# Patient Record
Sex: Female | Born: 2000 | Race: White | Hispanic: Yes | Marital: Single | Smoking: Never smoker
Health system: Southern US, Community
[De-identification: ages and names within clinical notes are randomized; demographics above are authoritative.]

## PROBLEM LIST (undated history)

## (undated) DIAGNOSIS — G932 Benign intracranial hypertension: Secondary | ICD-10-CM

## (undated) HISTORY — PX: CHOLECYSTECTOMY: SHX55

## (undated) HISTORY — DX: Benign intracranial hypertension: G93.2

---
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
## 2022-08-02 ENCOUNTER — Other Ambulatory Visit: Payer: Self-pay | Admitting: Ophthalmology

## 2022-08-02 DIAGNOSIS — G932 Benign intracranial hypertension: Secondary | ICD-10-CM

## 2022-10-14 DIAGNOSIS — Z1389 Encounter for screening for other disorder: Secondary | ICD-10-CM | POA: Diagnosis not present

## 2022-11-02 DIAGNOSIS — F411 Generalized anxiety disorder: Secondary | ICD-10-CM | POA: Diagnosis not present

## 2022-11-02 DIAGNOSIS — Z01419 Encounter for gynecological examination (general) (routine) without abnormal findings: Secondary | ICD-10-CM | POA: Diagnosis not present

## 2022-11-02 DIAGNOSIS — Z1389 Encounter for screening for other disorder: Secondary | ICD-10-CM | POA: Diagnosis not present

## 2022-11-02 DIAGNOSIS — Z23 Encounter for immunization: Secondary | ICD-10-CM | POA: Diagnosis not present

## 2022-11-02 DIAGNOSIS — Z113 Encounter for screening for infections with a predominantly sexual mode of transmission: Secondary | ICD-10-CM | POA: Diagnosis not present

## 2022-11-02 DIAGNOSIS — Z124 Encounter for screening for malignant neoplasm of cervix: Secondary | ICD-10-CM | POA: Diagnosis not present

## 2022-11-23 ENCOUNTER — Ambulatory Visit
Admission: RE | Admit: 2022-11-23 | Discharge: 2022-11-23 | Disposition: A | Discharge status: Home / Self Care | Payer: Medicaid Other | Source: Ambulatory Visit | Attending: Ophthalmology | Admitting: Ophthalmology

## 2022-11-23 DIAGNOSIS — G932 Benign intracranial hypertension: Secondary | ICD-10-CM

## 2022-11-23 DIAGNOSIS — H4710 Unspecified papilledema: Secondary | ICD-10-CM | POA: Diagnosis not present

## 2022-11-23 MED ORDER — GADOPICLENOL 0.5 MMOL/ML IV SOLN
7.5000 mL | Freq: Once | INTRAVENOUS | Status: AC | PRN
  Administered 2022-11-23: 7.5 mL via INTRAVENOUS

## 2022-12-23 ENCOUNTER — Ambulatory Visit: Payer: Medicaid Other | Admitting: Diagnostic Neuroimaging

## 2022-12-23 ENCOUNTER — Encounter: Payer: Self-pay | Admitting: Diagnostic Neuroimaging

## 2022-12-23 VITALS — BP 128/58 | HR 88 | Ht 67.0 in | Wt 276.8 lb

## 2022-12-23 DIAGNOSIS — G932 Benign intracranial hypertension: Secondary | ICD-10-CM

## 2022-12-23 MED ORDER — ACETAZOLAMIDE 250 MG PO TABS: 250.0000 mg | ORAL_TABLET | Freq: Two times a day (BID) | ORAL | 12 refills | Status: DC

## 2022-12-23 NOTE — Patient Instructions (Addendum)
  idiopathic intracranial hypertension (pseudotumor cerebri)   - check LP (opening pressure and therapeutic drainage)  - start acetazolamide 250mg  twice a day; increase to 500mg  twice a day after 1-2 weeks  - follow up with Dr. Manuella Ghazi in 3 months

## 2022-12-23 NOTE — Progress Notes (Signed)
[facility]  PATIENT: Brianna Sutton DOB: 02/20/2001  REFERRING CLINICIAN: Bryson Ha, OD HISTORY FROM: PATIENT  REASON FOR VISIT: NEW CONSULT   HISTORICAL  CHIEF COMPLAINT:  Chief Complaint  Patient presents with   New Patient (Initial Visit)    Patient in room #7 with her husband. Patient states she here to discuss and review her MRI of the brain.    HISTORY OF PRESENT ILLNESS:   22 year old female here for evaluation of papilledema.  Patient had routine eye exam in December 2023, and papilledema was noted.  She was referred to retina specialist for further evaluation.  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  MRI brain and orbits were ordered.  Patient has had some issues with weight gain in the last 3 months.  She has had some episodes of transient visual obscuration with colorful lights, with some episodes triggered by straining with weight lifting.  Sometimes associated with severe pressure explosion sensation in her head. She is also had some left-sided ringing in your ears where she can hear her heartbeat.  All the symptoms started in the last 3 months.  Patient had pregnancy and delivery in 2020 with epidural.  Now patient has IUD for contraception.    REVIEW OF SYSTEMS: Full 14 system review of systems performed and negative with exception of: as per HPI.  ALLERGIES: No Known Allergies  HOME MEDICATIONS: Outpatient Medications Prior to Visit  Medication Sig Dispense Refill   hydrOXYzine (VISTARIL) 25 MG capsule Take 1 capsule (25 mg total) by mouth 3 (three) times daily as needed. 30 capsule 0   sertraline (ZOLOFT) 50 MG tablet Take 50 mg by mouth daily.     predniSONE (DELTASONE) 10 MG tablet Take 5 tablets (50 mg total) by mouth daily with breakfast. 25 tablet 0   hydrOXYzine (ATARAX) 25 MG tablet Take 25 mg by mouth every 6 (six) hours as needed. (Patient not taking: Reported on 12/23/2022)     ranitidine (ZANTAC) 150 MG tablet Take 1  tablet (150 mg total) by mouth 2 (two) times daily. 14 tablet 1   No facility-administered medications prior to visit.    PAST MEDICAL HISTORY: No past medical history on file.  PAST SURGICAL HISTORY: No past surgical history on file.  FAMILY HISTORY: No family history on file.  SOCIAL HISTORY: Social History   Socioeconomic History   Marital status: Single    Spouse name: Not on file   Number of children: Not on file   Years of education: Not on file   Highest education level: Not on file  Occupational History   Not on file  Tobacco Use   Smoking status: Never   Smokeless tobacco: Not on file  Substance and Sexual Activity   Alcohol use: No   Drug use: Not on file   Sexual activity: Not on file  Other Topics Concern   Not on file  Social History Narrative   Not on file   Social Determinants of Health   Financial Resource Strain: Not on file  Food Insecurity: Not on file  Transportation Needs: Not on file  Physical Activity: Not on file  Stress: Not on file  Social Connections: Not on file  Intimate Partner Violence: Not on file     PHYSICAL EXAM  GENERAL EXAM/CONSTITUTIONAL: Vitals:  Vitals:   12/23/22 1454  BP: (!) 128/58  Pulse: 88  Weight: 276 lb 12.8 oz (125.6 kg)  Height: 5\' 7"  (1.702 m)   Body mass index is  43.35 kg/m. Wt Readings from Last 3 Encounters:  12/23/22 276 lb 12.8 oz (125.6 kg)  07/06/15 180 lb (81.6 kg) (96 %, Z= 1.80)*   * Growth percentiles are based on CDC (Girls, 2-20 Years) data.   Patient is in no distress; well developed, nourished and groomed; neck is supple  CARDIOVASCULAR: Examination of carotid arteries is normal; no carotid bruits Regular rate and rhythm, no murmurs Examination of peripheral vascular system by observation and palpation is normal  EYES: Ophthalmoscopic exam of optic discs and posterior segments is normal; no papilledema or hemorrhages No results found.  MUSCULOSKELETAL: Gait, strength,  tone, movements noted in Neurologic exam below  NEUROLOGIC: MENTAL STATUS:      No data to display         awake, alert, oriented to person, place and time recent and remote memory intact normal attention and concentration language fluent, comprehension intact, naming intact fund of knowledge appropriate  CRANIAL NERVE:  2nd - MILD BILATERAL PAPILLEDEMA 2nd, 3rd, 4th, 6th - pupils equal and reactive to light, visual fields full to confrontation, extraocular muscles intact, no nystagmus 5th - facial sensation symmetric 7th - facial strength symmetric 8th - hearing intact 9th - palate elevates symmetrically, uvula midline 11th - shoulder shrug symmetric 12th - tongue protrusion midline  MOTOR:  normal bulk and tone, full strength in the BUE, BLE  SENSORY:  normal and symmetric to light touch, temperature, vibration  COORDINATION:  finger-nose-finger, fine finger movements normal  REFLEXES:  deep tendon reflexes present and symmetric  GAIT/STATION:  narrow based gait     DIAGNOSTIC DATA (LABS, IMAGING, TESTING) - I reviewed patient records, labs, notes, testing and imaging myself where available.  Lab Results  Component Value Date   WBC 10.3 05/28/2017   HGB 13.9 05/28/2017   HCT 40.1 05/28/2017   MCV 82.7 05/28/2017   PLT 263 05/28/2017      Component Value Date/Time   NA 142 05/28/2017 1130   NA 138 07/25/2013 0607   K 4.0 05/28/2017 1130   K 3.8 07/25/2013 0607   CL 108 05/28/2017 1130   CL 107 07/25/2013 0607   CO2 25 05/28/2017 1130   CO2 24 07/25/2013 0607   GLUCOSE 101 (H) 05/28/2017 1130   GLUCOSE 97 07/25/2013 0607   BUN 8 05/28/2017 1130   BUN 10 07/25/2013 0607   CREATININE 0.63 05/28/2017 1130   CREATININE 0.61 07/25/2013 0607   CALCIUM 10.0 05/28/2017 1130   CALCIUM 9.2 07/25/2013 0607   PROT 7.9 05/28/2017 1130   PROT 7.4 07/25/2013 0607   ALBUMIN 4.6 05/28/2017 1130   ALBUMIN 4.1 07/25/2013 0607   AST 22 05/28/2017 1130   AST  16 07/25/2013 0607   ALT 17 05/28/2017 1130   ALT 17 07/25/2013 0607   ALKPHOS 85 05/28/2017 1130   ALKPHOS 158 07/25/2013 0607   BILITOT 0.5 05/28/2017 1130   BILITOT 0.5 07/25/2013 0607   GFRNONAA NOT CALCULATED 05/28/2017 1130   GFRAA NOT CALCULATED 05/28/2017 1130   Lab Results  Component Value Date   CHOL 205 (H) 05/28/2017   HDL 40 (L) 05/28/2017   LDLCALC 137 (H) 05/28/2017   TRIG 138 05/28/2017   CHOLHDL 5.1 05/28/2017   Lab Results  Component Value Date   HGBA1C 5.0 05/28/2017   No results found for: "VITAMINB12" Lab Results  Component Value Date   TSH 3.000 05/22/2016    11/23/22 MRI brain / orbits -Intra-ocular protrusion of the optic nerve heads bilaterally. Vertical tortuosity  of the intraorbital optic nerves with mild CSF prominence in the bilateral optic nerve sheaths. This constellation of findings suggests idiopathic intracranial hypertension (pseudotumor cerebri). -Additionally, narrowing of the bilateral distal transverse dural venous sinuses is questioned. This finding can also be seen in the setting of idiopathic intracranial hypertension. Consider an MR venogram of the head for further evaluation. 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 Otherwise unremarkable MRI appearance of the brain itself.    ASSESSMENT AND PLAN  22 y.o. year old female here with:  Dx:  1. IIH (idiopathic intracranial hypertension)     PLAN:  idiopathic intracranial hypertension (pseudotumor cerebri)  - check LP (opening pressure and therapeutic drainage) - start acetazolamide 250mg  twice a day; increase to 500mg  twice a day after 1-2 weeks - mild gradual weight loss encouraged - follow up with Dr. Manuella Ghazi in 3 months; then follow up with me in 4 months  Orders Placed This Encounter  Procedures   DG FL GUIDED LUMBAR PUNCTURE   Meds ordered this encounter  Medications   acetaZOLAMIDE (DIAMOX) 250 MG tablet    Sig: Take 1 tablet  (250 mg total) by mouth 2 (two) times daily.    Dispense:  60 tablet    Refill:  12   Return in about 4 months (around 04/24/2023).  I reviewed images, labs, notes, records myself. I summarized findings and reviewed with patient, for this high risk condition (idiopathic intracranial hypertension (pseudotumor cerebri)) requiring high complexity decision making.   Penni Bombard, MD [id number], [id number] PM Certified in Neurology, Neurophysiology and Neuroimaging  [facility] [address] [place], [place] [address] [phone]

## 2023-01-26 ENCOUNTER — Telehealth: Payer: Self-pay | Admitting: Diagnostic Neuroimaging

## 2023-01-26 NOTE — Telephone Encounter (Signed)
Sent mychart msg informing pt of appointment change due to provider template change 

## 2023-02-09 ENCOUNTER — Ambulatory Visit
Admission: RE | Admit: 2023-02-09 | Discharge: 2023-02-09 | Disposition: A | Discharge status: Home / Self Care | Payer: Medicaid Other | Source: Ambulatory Visit | Attending: Diagnostic Neuroimaging | Admitting: Diagnostic Neuroimaging

## 2023-02-09 DIAGNOSIS — G932 Benign intracranial hypertension: Secondary | ICD-10-CM | POA: Diagnosis not present

## 2023-02-09 DIAGNOSIS — H4710 Unspecified papilledema: Secondary | ICD-10-CM | POA: Diagnosis not present

## 2023-02-09 LAB — CSF CELL COUNT WITH DIFFERENTIAL: TOTAL NUCLEATED CELL: 1 cells/uL (ref 0–5)

## 2023-02-09 LAB — CSF CULTURE W GRAM STAIN: MICRO NUMBER:: 14929560

## 2023-02-09 LAB — GLUCOSE, CSF: Glucose, CSF: 54 mg/dL (ref 40–80)

## 2023-02-09 NOTE — Discharge Instructions (Signed)

## 2023-02-10 LAB — CSF CULTURE W GRAM STAIN: SPECIMEN QUALITY:: ADEQUATE

## 2023-02-10 LAB — CSF CELL COUNT WITH DIFFERENTIAL

## 2023-02-13 LAB — CSF CELL COUNT WITH DIFFERENTIAL: RBC Count, CSF: 0 cells/uL

## 2023-02-13 LAB — PROTEIN, CSF: Total Protein, CSF: 20 mg/dL (ref 15–45)

## 2023-02-13 LAB — CSF CULTURE W GRAM STAIN: Result:: NO GROWTH

## 2023-03-01 ENCOUNTER — Telehealth: Payer: Self-pay | Admitting: Diagnostic Neuroimaging

## 2023-03-01 MED ORDER — ACETAZOLAMIDE 250 MG PO TABS: 500.0000 mg | ORAL_TABLET | Freq: Three times a day (TID) | ORAL | 12 refills | Status: DC

## 2023-03-01 NOTE — Addendum Note (Signed)
Addended by: Joycelyn Schmid R on: 03/01/2023 05:30 PM   Modules accepted: Orders

## 2023-03-01 NOTE — Telephone Encounter (Signed)
Increase acetazolamide to 500mg  twice a day x 2-4 weeks, then 500mg  three times a day.  Meds ordered this encounter  Medications   acetaZOLAMIDE (DIAMOX) 250 MG tablet    Sig: Take 2 tablets (500 mg total) by mouth 3 (three) times daily.    Dispense:  180 tablet    Refill:  12   Suanne Marker, MD 03/01/2023, 5:30 PM Certified in Neurology, Neurophysiology and Neuroimaging  [facility] [address] [place], [place] [address] [phone]

## 2023-03-01 NOTE — Telephone Encounter (Signed)
Called patient for more info. Patient states her eye Dr check her vision and eye pressure and stated her eye nerve has gotten worse and need to increase her Rx medication. I advise patient to have he eye Dr to faxes over notes from her visit. Patient stated she would. Pt verbalized understanding. Pt had no questions at this time but was encouraged to call back if questions arise.

## 2023-03-01 NOTE — Telephone Encounter (Signed)
Pt said her eye doctor told her that medication acetaZOLAMIDE (DIAMOX) 250 MG tablet  needs to be increased.

## 2023-03-02 NOTE — Telephone Encounter (Signed)
Called patient and LVM Per Dr. Marjory Lies "  Increase acetazolamide to 500mg  twice a day x 2-4 weeks, then 500mg  three times a day.  I informed patient to give [place] a call back if she had any questions at this time but was encouraged to call back if questions arise.

## 2023-04-18 ENCOUNTER — Telehealth: Payer: Self-pay | Admitting: Diagnostic Neuroimaging

## 2023-04-18 NOTE — Telephone Encounter (Signed)
LVM and sent mychart msg informing pt of need to reschedule 04/25/23 appt- MD out 

## 2023-04-21 ENCOUNTER — Ambulatory Visit: Payer: Medicaid Other | Admitting: Diagnostic Neuroimaging

## 2023-04-25 ENCOUNTER — Ambulatory Visit: Payer: Medicaid Other | Admitting: Diagnostic Neuroimaging

## 2023-04-25 ENCOUNTER — Ambulatory Visit: Payer: Medicaid Other | Admitting: Nurse Practitioner

## 2023-04-25 NOTE — Progress Notes (Deleted)
   There were no vitals taken for this visit.   Subjective:    Patient ID: Brianna Sutton, female    DOB: 05/13/2001, 22 y.o.   MRN: [id number]  HPI: Brianna Sutton is a 22 y.o. female  No chief complaint on file.  Establish care: her last physical was ***.  Medical history includes ***.  Family history includes ***.  Health maintenance ***.   Relevant past medical, surgical, family and social history reviewed and updated as indicated. Interim medical history since our last visit reviewed. Allergies and medications reviewed and updated.  Review of Systems  Constitutional: Negative for fever or weight change.  Respiratory: Negative for cough and shortness of breath.   Cardiovascular: Negative for chest pain or palpitations.  Gastrointestinal: Negative for abdominal pain, no bowel changes.  Musculoskeletal: Negative for gait problem or joint swelling.  Skin: Negative for rash.  Neurological: Negative for dizziness or headache.  No other specific complaints in a complete review of systems (except as listed in HPI above).      Objective:    There were no vitals taken for this visit.  Wt Readings from Last 3 Encounters:  12/23/22 276 lb 12.8 oz (125.6 kg)  07/06/15 180 lb (81.6 kg) (96%, Z= 1.80)*   * Growth percentiles are based on CDC (Girls, 2-20 Years) data.    Physical Exam  Constitutional: Patient appears well-developed and well-nourished. Obese *** No distress.  HEENT: head atraumatic, normocephalic, pupils equal and reactive to light, ears ***, neck supple, throat within normal limits Cardiovascular: Normal rate, regular rhythm and normal heart sounds.  No murmur heard. No BLE edema. Pulmonary/Chest: Effort normal and breath sounds normal. No respiratory distress. Abdominal: Soft.  There is no tenderness. Psychiatric: Patient has a normal mood and affect. behavior is normal. Judgment and thought content normal.      Assessment & Plan:   Problem List  Items Addressed This Visit   None    Follow up plan: No follow-ups on file.

## 2023-05-09 ENCOUNTER — Ambulatory Visit: Payer: Medicaid Other | Admitting: Diagnostic Neuroimaging

## 2023-05-09 ENCOUNTER — Encounter: Payer: Self-pay | Admitting: Diagnostic Neuroimaging

## 2023-05-09 VITALS — BP 122/53 | HR 73 | Ht 70.0 in | Wt 282.0 lb

## 2023-05-09 DIAGNOSIS — Z6841 Body Mass Index (BMI) 40.0 and over, adult: Secondary | ICD-10-CM

## 2023-05-09 DIAGNOSIS — G4710 Hypersomnia, unspecified: Secondary | ICD-10-CM | POA: Diagnosis not present

## 2023-05-09 DIAGNOSIS — G932 Benign intracranial hypertension: Secondary | ICD-10-CM

## 2023-05-09 MED ORDER — ACETAZOLAMIDE ER 500 MG PO CP12: ORAL_CAPSULE | ORAL | 12 refills | Status: DC

## 2023-05-09 NOTE — Progress Notes (Signed)
[facility]  PATIENT: Brianna Sutton DOB: 2001-02-10  REFERRING CLINICIAN: Pediatrics, Blima Rich HISTORY FROM: PATIENT  REASON FOR VISIT: NEW CONSULT   HISTORICAL  CHIEF COMPLAINT:  Chief Complaint  Patient presents with   Follow-up    Patient in room #6 with her mother and daughter. Patient states when she tries to work out see feels fatigue and may be due to the Rx.    HISTORY OF PRESENT ILLNESS:   UPDATE (05/09/23, VRP): Since last visit, increased acetazolamide in May 2024; symptoms overall improved, but not totally resolved. Eye exam in June was slightly worse. Trying to walk and exercise.  PRIOR HPI (12/23/22, VRP): 22 year old female here for evaluation of papilledema.  Patient had routine eye exam in December 2023, and papilledema was noted.  She was referred to retina specialist for further evaluation.  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  MRI brain and orbits were ordered.  Patient has had some issues with weight gain in the last 3 months.  She has had some episodes of transient visual obscuration with colorful lights, with some episodes triggered by straining with weight lifting.  Sometimes associated with severe pressure explosion sensation in her head. She is also had some left-sided ringing in your ears where she can hear her heartbeat.  All the symptoms started in the last 3 months.  Patient had pregnancy and delivery in 2020 with epidural.  Now patient has IUD for contraception.    REVIEW OF SYSTEMS: Full 14 system review of systems performed and negative with exception of: as per HPI.  ALLERGIES: No Known Allergies  HOME MEDICATIONS: Outpatient Medications Prior to Visit  Medication Sig Dispense Refill   acetaminophen (TYLENOL) 325 MG tablet Take 325 mg by mouth every 4 (four) hours as needed.     acetaZOLAMIDE (DIAMOX) 250 MG tablet Take 2 tablets (500 mg total) by mouth 3 (three) times daily. 180 tablet 12   hydrOXYzine (VISTARIL) 25 MG  capsule Take 1 capsule (25 mg total) by mouth 3 (three) times daily as needed. 30 capsule 0   sertraline (ZOLOFT) 50 MG tablet Take 50 mg by mouth daily.     No facility-administered medications prior to visit.    PAST MEDICAL HISTORY: History reviewed. No pertinent past medical history.  PAST SURGICAL HISTORY: History reviewed. No pertinent surgical history.  FAMILY HISTORY: History reviewed. No pertinent family history.  SOCIAL HISTORY: Social History   Socioeconomic History   Marital status: Single    Spouse name: Not on file   Number of children: Not on file   Years of education: Not on file   Highest education level: Not on file  Occupational History   Not on file  Tobacco Use   Smoking status: Never   Smokeless tobacco: Not on file  Substance and Sexual Activity   Alcohol use: No   Drug use: Not on file   Sexual activity: Not on file  Other Topics Concern   Not on file  Social History Narrative   Not on file   Social Determinants of Health   Financial Resource Strain: Not on file  Food Insecurity: Not on file  Transportation Needs: Not on file  Physical Activity: Not on file  Stress: Not on file  Social Connections: Not on file  Intimate Partner Violence: Not on file     PHYSICAL EXAM  GENERAL EXAM/CONSTITUTIONAL: Vitals:  Vitals:   05/09/23 1547  BP: (!) 122/53  Pulse: 73  Weight: 282 lb (127.9 kg)  Height: 5\' 10"  (1.778 m)   Body mass index is 40.46 kg/m. Wt Readings from Last 3 Encounters:  05/09/23 282 lb (127.9 kg)  12/23/22 276 lb 12.8 oz (125.6 kg)  07/06/15 180 lb (81.6 kg) (96%, Z= 1.80)*   * Growth percentiles are based on CDC (Girls, 2-20 Years) data.   Patient is in no distress; well developed, nourished and groomed; neck is supple  CARDIOVASCULAR: Examination of carotid arteries is normal; no carotid bruits Regular rate and rhythm, no murmurs Examination of peripheral vascular system by observation and palpation is  normal  EYES: Ophthalmoscopic exam of optic discs and posterior segments is normal; no papilledema or hemorrhages No results found.  MUSCULOSKELETAL: Gait, strength, tone, movements noted in Neurologic exam below  NEUROLOGIC: MENTAL STATUS:      No data to display         awake, alert, oriented to person, place and time recent and remote memory intact normal attention and concentration language fluent, comprehension intact, naming intact fund of knowledge appropriate  CRANIAL NERVE:  2nd - MILD BILATERAL PAPILLEDEMA 2nd, 3rd, 4th, 6th - pupils equal and reactive to light, visual fields full to confrontation, extraocular muscles intact, no nystagmus 5th - facial sensation symmetric 7th - facial strength symmetric 8th - hearing intact 9th - palate elevates symmetrically, uvula midline 11th - shoulder shrug symmetric 12th - tongue protrusion midline  MOTOR:  normal bulk and tone, full strength in the BUE, BLE  SENSORY:  normal and symmetric to light touch, temperature, vibration  COORDINATION:  finger-nose-finger, fine finger movements normal  REFLEXES:  deep tendon reflexes present and symmetric  GAIT/STATION:  narrow based gait     DIAGNOSTIC DATA (LABS, IMAGING, TESTING) - I reviewed patient records, labs, notes, testing and imaging myself where available.  Lab Results  Component Value Date   WBC 10.3 05/28/2017   HGB 13.9 05/28/2017   HCT 40.1 05/28/2017   MCV 82.7 05/28/2017   PLT 263 05/28/2017      Component Value Date/Time   NA 142 05/28/2017 1130   NA 138 07/25/2013 0607   K 4.0 05/28/2017 1130   K 3.8 07/25/2013 0607   CL 108 05/28/2017 1130   CL 107 07/25/2013 0607   CO2 25 05/28/2017 1130   CO2 24 07/25/2013 0607   GLUCOSE 101 (H) 05/28/2017 1130   GLUCOSE 97 07/25/2013 0607   BUN 8 05/28/2017 1130   BUN 10 07/25/2013 0607   CREATININE 0.63 05/28/2017 1130   CREATININE 0.61 07/25/2013 0607   CALCIUM 10.0 05/28/2017 1130   CALCIUM  9.2 07/25/2013 0607   PROT 7.9 05/28/2017 1130   PROT 7.4 07/25/2013 0607   ALBUMIN 4.6 05/28/2017 1130   ALBUMIN 4.1 07/25/2013 0607   AST 22 05/28/2017 1130   AST 16 07/25/2013 0607   ALT 17 05/28/2017 1130   ALT 17 07/25/2013 0607   ALKPHOS 85 05/28/2017 1130   ALKPHOS 158 07/25/2013 0607   BILITOT 0.5 05/28/2017 1130   BILITOT 0.5 07/25/2013 0607   GFRNONAA NOT CALCULATED 05/28/2017 1130   GFRAA NOT CALCULATED 05/28/2017 1130   Lab Results  Component Value Date   CHOL 205 (H) 05/28/2017   HDL 40 (L) 05/28/2017   LDLCALC 137 (H) 05/28/2017   TRIG 138 05/28/2017   CHOLHDL 5.1 05/28/2017   Lab Results  Component Value Date   HGBA1C 5.0 05/28/2017   No results found for: "VITAMINB12" Lab Results  Component Value Date   TSH 3.000 05/22/2016  11/23/22 MRI brain / orbits -Intra-ocular protrusion of the optic nerve heads bilaterally. Vertical tortuosity of the intraorbital optic nerves with mild CSF prominence in the bilateral optic nerve sheaths. This constellation of findings suggests idiopathic intracranial hypertension (pseudotumor cerebri). -Additionally, narrowing of the bilateral distal transverse dural venous sinuses is questioned. This finding can also be seen in the setting of idiopathic intracranial hypertension. Consider an MR venogram of the head for further evaluation. 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 Otherwise unremarkable MRI appearance of the brain itself.  LP 1. Technically successful fluoroscopically guided lumbar puncture. 2. Mildly elevated opening CSF pressure of 23 cm water.    ASSESSMENT AND PLAN  22 y.o. year old female here with:  Dx:  1. IIH (idiopathic intracranial hypertension)   2. BMI 40.0-44.9, adult (HCC)   3. Excessive sleepiness      PLAN:  idiopathic intracranial hypertension (pseudotumor cerebri)  - continue acetazolamide (change dosing schedule to ER; 500mg  in AM and 1000mg  in  PM)  - check sleep study (BMI 40, daytime sleepiness, fatigue; eval for OSA) - mild gradual weight loss encouraged; consider GLP1 agonist medication - follow up with Dr. Sherryll Burger for serial exams  Orders Placed This Encounter  Procedures   Ambulatory referral to Sleep Studies   Meds ordered this encounter  Medications   acetaZOLAMIDE ER (DIAMOX) 500 MG capsule    Sig: Take 1 capsule (500 mg total) by mouth every morning AND 2 capsules (1,000 mg total) every evening.    Dispense:  90 capsule    Refill:  12   Return in about 6 months (around 11/09/2023).    Suanne Marker, MD 05/09/2023, 4:11 PM Certified in Neurology, Neurophysiology and Neuroimaging  [facility] [address] [place], [place] [address] [phone]

## 2023-05-09 NOTE — Patient Instructions (Signed)
idiopathic intracranial hypertension (pseudotumor cerebri)  - continue acetazolamide 500mg  three times a day  - mild gradual weight loss encouraged; consider GLP1 agonist medication - follow up with Dr. Sherryll Burger for serial exams

## 2023-05-27 ENCOUNTER — Ambulatory Visit: Payer: Medicaid Other | Admitting: Nurse Practitioner

## 2023-05-27 ENCOUNTER — Encounter: Payer: Self-pay | Admitting: Nurse Practitioner

## 2023-05-27 ENCOUNTER — Other Ambulatory Visit: Payer: Self-pay

## 2023-05-27 VITALS — BP 120/82 | HR 71 | Temp 98.2°F | Resp 16 | Ht 67.0 in | Wt 284.3 lb

## 2023-05-27 DIAGNOSIS — Z975 Presence of (intrauterine) contraceptive device: Secondary | ICD-10-CM

## 2023-05-27 DIAGNOSIS — Z1159 Encounter for screening for other viral diseases: Secondary | ICD-10-CM | POA: Diagnosis not present

## 2023-05-27 DIAGNOSIS — Z13220 Encounter for screening for lipoid disorders: Secondary | ICD-10-CM | POA: Diagnosis not present

## 2023-05-27 DIAGNOSIS — G932 Benign intracranial hypertension: Secondary | ICD-10-CM

## 2023-05-27 DIAGNOSIS — Z131 Encounter for screening for diabetes mellitus: Secondary | ICD-10-CM

## 2023-05-27 DIAGNOSIS — Z114 Encounter for screening for human immunodeficiency virus [HIV]: Secondary | ICD-10-CM

## 2023-05-27 DIAGNOSIS — Z7689 Persons encountering health services in other specified circumstances: Secondary | ICD-10-CM | POA: Diagnosis not present

## 2023-05-27 DIAGNOSIS — Z6841 Body Mass Index (BMI) 40.0 and over, adult: Secondary | ICD-10-CM

## 2023-05-27 DIAGNOSIS — Z130 Encounter for screening for diseases of the blood and blood-forming organs and certain disorders involving the immune mechanism: Secondary | ICD-10-CM

## 2023-05-27 NOTE — Assessment & Plan Note (Signed)
Requested nutritionist evaluation. Referral placed.

## 2023-05-27 NOTE — Assessment & Plan Note (Signed)
Managed by neurology, has a sleep study scheduled in September. Doing well, no changes

## 2023-05-27 NOTE — Progress Notes (Signed)
BP 120/82   Pulse 71   Temp 98.2 F (36.8 C) (Oral)   Resp 16   Ht 5\' 7"  (1.702 m)   Wt 284 lb 4.8 oz (129 kg)   LMP 01/19/2023   SpO2 97%   BMI 44.53 kg/m    Subjective:    Patient ID: Brianna Sutton, female    DOB: [birth year], 22 y.o.   MRN: [id number]  HPI: Brianna Sutton is a 22 y.o. female  Chief Complaint  Patient presents with   Establish Care   Referral    GYN to have IUD removed   Establish care: her last physical was 4 years ago.  Medical history includes IIH.  Family history includes none.  Health maintenance up to date on pap. Due for labs  IIH (idiopathic intracranial hypertension) : established with neurology.  Currently on Diamox.   Last saw neurology on 05/09/2023, she is also scheduled to have sleep study done in September.  She was seeing neurology every three months and is now seeing him every 6 months.   IUD: she currently has an IUD and would like it removed. Referral placed to GYN. She reports she has had it for 4 years.    Obesity:  Current weight : 284 lbs BMI: 44.53 Highest weight:328 lbs Treatment Tried: lifestyle modification Comorbidities: IIH  Patient would like to see a nutritionist.  Relevant past medical, surgical, family and social history reviewed and updated as indicated. Interim medical history since our last visit reviewed. Allergies and medications reviewed and updated.  Review of Systems  Constitutional: Negative for fever or weight change.  Respiratory: Negative for cough and shortness of breath.   Cardiovascular: Negative for chest pain or palpitations.  Gastrointestinal: Negative for abdominal pain, no bowel changes.  Musculoskeletal: Negative for gait problem or joint swelling.  Skin: Negative for rash.  Neurological: Negative for dizziness or headache.  No other specific complaints in a complete review of systems (except as listed in HPI above).      Objective:    BP 120/82   Pulse 71   Temp 98.2 F  (36.8 C) (Oral)   Resp 16   Ht 5\' 7"  (1.702 m)   Wt 284 lb 4.8 oz (129 kg)   LMP 01/19/2023   SpO2 97%   BMI 44.53 kg/m   Wt Readings from Last 3 Encounters:  05/27/23 284 lb 4.8 oz (129 kg)  05/09/23 282 lb (127.9 kg)  12/23/22 276 lb 12.8 oz (125.6 kg)    Physical Exam  Constitutional: Patient appears well-developed and well-nourished. Obese  No distress.  HEENT: head atraumatic, normocephalic, pupils equal and reactive to light, neck supple Cardiovascular: Normal rate, regular rhythm and normal heart sounds.  No murmur heard. No BLE edema. Pulmonary/Chest: Effort normal and breath sounds normal. No respiratory distress. Abdominal: Soft.  There is no tenderness. Psychiatric: Patient has a normal mood and affect. behavior is normal. Judgment and thought content normal.      Assessment & Plan:   Problem List Items Addressed This Visit       Nervous and Auditory   IIH (idiopathic intracranial hypertension)    Managed by neurology, has a sleep study scheduled in September. Doing well, no changes        Other   Class 3 severe obesity due to excess calories with serious comorbidity and body mass index (BMI) of 45.0 to 49.9 in adult [facility]) - Primary    Requested nutritionist evaluation. Referral  placed.       Relevant Orders   Amb ref to Medical Nutrition Therapy-MNT   TSH   Other Visit Diagnoses     Uses intrauterine device for birth control       referral placed to gyn for removal   Relevant Orders   Ambulatory referral to Gynecology   Encounter to establish care       requesting records for most recent pap   Screening for diabetes mellitus       Relevant Orders   COMPLETE METABOLIC PANEL WITH GFR   Hemoglobin A1c   Encounter for hepatitis C screening test for low risk patient       Relevant Orders   Hepatitis C antibody   Screening for HIV without presence of risk factors       Relevant Orders   HIV Antibody (routine testing w rflx)   Screening for  deficiency anemia       Relevant Orders   CBC with Differential/Platelet   Screening for cholesterol level       Relevant Orders   Lipid panel        Follow up plan: Return in about 1 year (around 05/26/2024) for cpe.

## 2023-05-28 LAB — CBC WITH DIFFERENTIAL/PLATELET
Absolute Monocytes: 437 {cells}/uL (ref 200–950)
Basophils Absolute: 18 {cells}/uL (ref 0–200)
Basophils Relative: 0.2 %
Eosinophils Absolute: 82 {cells}/uL (ref 15–500)
Eosinophils Relative: 0.9 %
HCT: 41.5 % (ref 35.0–45.0)
Hemoglobin: 13.4 g/dL (ref 11.7–15.5)
Lymphs Abs: 2512 {cells}/uL (ref 850–3900)
MCH: 27.8 pg (ref 27.0–33.0)
MCHC: 32.3 g/dL (ref 32.0–36.0)
MCV: 86.1 fL (ref 80.0–100.0)
MPV: 10.5 fL (ref 7.5–12.5)
Monocytes Relative: 4.8 %
Neutro Abs: 6052 {cells}/uL (ref 1500–7800)
Neutrophils Relative %: 66.5 %
Platelets: 301 10*3/uL (ref 140–400)
RBC: 4.82 10*6/uL (ref 3.80–5.10)
RDW: 13 % (ref 11.0–15.0)
Total Lymphocyte: 27.6 %
WBC: 9.1 10*3/uL (ref 3.8–10.8)

## 2023-05-28 LAB — HEPATITIS C ANTIBODY: Hepatitis C Ab: NONREACTIVE

## 2023-05-28 LAB — COMPLETE METABOLIC PANEL WITH GFR
AG Ratio: 1.7 (calc) (ref 1.0–2.5)
ALT: 24 U/L (ref 6–29)
AST: 20 U/L (ref 10–30)
Albumin: 4.7 g/dL (ref 3.6–5.1)
Alkaline phosphatase (APISO): 80 U/L (ref 31–125)
BUN: 7 mg/dL (ref 7–25)
CO2: 27 mmol/L (ref 20–32)
Calcium: 10.4 mg/dL — ABNORMAL HIGH (ref 8.6–10.2)
Chloride: 103 mmol/L (ref 98–110)
Creat: 0.7 mg/dL (ref 0.50–0.96)
Globulin: 2.7 g/dL (ref 1.9–3.7)
Glucose, Bld: 92 mg/dL (ref 65–99)
Potassium: 4.1 mmol/L (ref 3.5–5.3)
Sodium: 140 mmol/L (ref 135–146)
Total Bilirubin: 0.6 mg/dL (ref 0.2–1.2)
Total Protein: 7.4 g/dL (ref 6.1–8.1)
eGFR: 125 mL/min/{1.73_m2} (ref >=60)

## 2023-05-28 LAB — HIV ANTIBODY (ROUTINE TESTING W REFLEX): HIV 1&2 Ab, 4th Generation: NONREACTIVE

## 2023-05-28 LAB — TSH: TSH: 2.02 m[IU]/L

## 2023-05-28 LAB — HEMOGLOBIN A1C
Hgb A1c MFr Bld: 5.5 %{Hb} (ref <5.7)
Mean Plasma Glucose: 111 mg/dL
eAG (mmol/L): 6.2 mmol/L

## 2023-05-28 LAB — LIPID PANEL
Cholesterol: 195 mg/dL (ref <200)
HDL: 39 mg/dL — ABNORMAL LOW (ref >=50)
LDL Cholesterol (Calc): 132 mg/dL — ABNORMAL HIGH
Non-HDL Cholesterol (Calc): 156 mg/dL — ABNORMAL HIGH (ref <130)
Total CHOL/HDL Ratio: 5 (calc) — ABNORMAL HIGH (ref <5.0)
Triglycerides: 127 mg/dL (ref <150)

## 2023-06-21 ENCOUNTER — Institutional Professional Consult (permissible substitution): Payer: Medicaid Other | Admitting: Neurology

## 2023-06-21 ENCOUNTER — Encounter: Payer: Self-pay | Admitting: Neurology

## 2023-09-04 HISTORY — PX: LUMBAR PUNCTURE: SHX1985

## 2023-10-19 ENCOUNTER — Emergency Department
Admission: EM | Admit: 2023-10-19 | Discharge: 2023-10-19 | Disposition: A | Discharge status: Home / Self Care | Payer: BLUE CROSS/BLUE SHIELD | Attending: Emergency Medicine | Admitting: Emergency Medicine

## 2023-10-19 ENCOUNTER — Other Ambulatory Visit: Payer: Self-pay

## 2023-10-19 ENCOUNTER — Emergency Department: Payer: BLUE CROSS/BLUE SHIELD

## 2023-10-19 DIAGNOSIS — R110 Nausea: Secondary | ICD-10-CM | POA: Diagnosis not present

## 2023-10-19 DIAGNOSIS — Z9049 Acquired absence of other specified parts of digestive tract: Secondary | ICD-10-CM | POA: Diagnosis not present

## 2023-10-19 DIAGNOSIS — N760 Acute vaginitis: Secondary | ICD-10-CM | POA: Insufficient documentation

## 2023-10-19 DIAGNOSIS — R630 Anorexia: Secondary | ICD-10-CM | POA: Insufficient documentation

## 2023-10-19 DIAGNOSIS — R1031 Right lower quadrant pain: Secondary | ICD-10-CM | POA: Diagnosis not present

## 2023-10-19 DIAGNOSIS — B9689 Other specified bacterial agents as the cause of diseases classified elsewhere: Secondary | ICD-10-CM | POA: Insufficient documentation

## 2023-10-19 LAB — CBC
HCT: 44.4 % (ref 36.0–46.0)
Hemoglobin: 14.2 g/dL (ref 12.0–15.0)
MCH: 28.5 pg (ref 26.0–34.0)
MCHC: 32 g/dL (ref 30.0–36.0)
MCV: 89.2 fL (ref 80.0–100.0)
Platelets: 290 10*3/uL (ref 150–400)
RBC: 4.98 MIL/uL (ref 3.87–5.11)
RDW: 13 % (ref 11.5–15.5)
WBC: 9.5 10*3/uL (ref 4.0–10.5)
nRBC: 0 % (ref 0.0–0.2)

## 2023-10-19 LAB — COMPREHENSIVE METABOLIC PANEL
ALT: 31 U/L (ref 0–44)
AST: 24 U/L (ref 15–41)
Albumin: 4.6 g/dL (ref 3.5–5.0)
Alkaline Phosphatase: 80 U/L (ref 38–126)
Anion gap: 10 (ref 5–15)
BUN: 9 mg/dL (ref 6–20)
CO2: 21 mmol/L — ABNORMAL LOW (ref 22–32)
Calcium: 9.6 mg/dL (ref 8.9–10.3)
Chloride: 109 mmol/L (ref 98–111)
Creatinine, Ser: 0.78 mg/dL (ref 0.44–1.00)
GFR, Estimated: >60 mL/min (ref >60)
Glucose, Bld: 108 mg/dL — ABNORMAL HIGH (ref 70–99)
Potassium: 3.6 mmol/L (ref 3.5–5.1)
Sodium: 140 mmol/L (ref 135–145)
Total Bilirubin: 1 mg/dL (ref 0.0–1.2)
Total Protein: 8 g/dL (ref 6.5–8.1)

## 2023-10-19 LAB — URINALYSIS, ROUTINE W REFLEX MICROSCOPIC
Bacteria, UA: NONE SEEN
Bilirubin Urine: NEGATIVE
Glucose, UA: NEGATIVE mg/dL
Hgb urine dipstick: NEGATIVE
Ketones, ur: NEGATIVE mg/dL
Nitrite: NEGATIVE
Protein, ur: NEGATIVE mg/dL
Specific Gravity, Urine: 1.023 (ref 1.005–1.030)
Squamous Epithelial / HPF: >50 /[HPF] (ref 0–5)
pH: 5 (ref 5.0–8.0)

## 2023-10-19 LAB — WET PREP, GENITAL
Sperm: NONE SEEN
Trich, Wet Prep: NONE SEEN
WBC, Wet Prep HPF POC: <10 (ref <10)
Yeast Wet Prep HPF POC: NONE SEEN

## 2023-10-19 LAB — CHLAMYDIA/NGC RT PCR (ARMC ONLY)
Chlamydia Tr: NOT DETECTED
N gonorrhoeae: NOT DETECTED

## 2023-10-19 LAB — POC URINE PREG, ED: Preg Test, Ur: NEGATIVE

## 2023-10-19 LAB — LIPASE, BLOOD: Lipase: 46 U/L (ref 11–51)

## 2023-10-19 MED ORDER — METRONIDAZOLE 500 MG PO TABS: 500.0000 mg | ORAL_TABLET | Freq: Three times a day (TID) | ORAL | 0 refills | Status: AC

## 2023-10-19 MED ORDER — ONDANSETRON 4 MG PO TBDP: 4.0000 mg | ORAL_TABLET | Freq: Three times a day (TID) | ORAL | 0 refills | Status: AC | PRN

## 2023-10-19 MED ORDER — IOHEXOL 300 MG/ML  SOLN
100.0000 mL | Freq: Once | INTRAMUSCULAR | Status: AC | PRN
  Administered 2023-10-19: 100 mL via INTRAVENOUS

## 2023-10-19 MED ORDER — FENTANYL CITRATE PF 50 MCG/ML IJ SOSY
50.0000 ug | PREFILLED_SYRINGE | Freq: Once | INTRAMUSCULAR | Status: AC
  Administered 2023-10-19: 50 ug via INTRAVENOUS
  Filled 2023-10-19 (×2): qty 1

## 2023-10-19 MED ORDER — ONDANSETRON HCL 4 MG/2ML IJ SOLN
4.0000 mg | Freq: Once | INTRAMUSCULAR | Status: AC
  Administered 2023-10-19: 4 mg via INTRAVENOUS
  Filled 2023-10-19: qty 2

## 2023-10-19 NOTE — Progress Notes (Signed)
 Pt s/p CT this AM with subsequent extravasation of saline/contrast (approx 50 mL) into right antecubital venous acess site.  Area currently soft but edematous, NT, intact distal pulses.  Sensation and ROM intact to arm, hand, and fingers.   Recommend cool compress (can alternate with warm compresses as needed) to site and arm elevation as much as possible over next 24 hrs to promote adequate venous drainage.   RN made aware.  Malikah Lakey, MS RD PA-C

## 2023-10-19 NOTE — ED Provider Notes (Signed)
 [facility] Provider Note    Event Date/Time   First MD Initiated Contact with Patient 10/19/23 [phone]     (approximate)   History   Abdominal Pain   HPI  Brianna Sutton is a 23 y.o. female with IUD who comes in with right lower quadrant pain.  Patient reports lack of appetite and positive nausea.  Patient reports that symptoms have been started since yesterday, constant.  She reports an IUD that is been in place for 5 years.  She reports that she has had 1 prior pregnancy 5 years ago.  She denies any history of ovarian cyst.  She denies any new vaginal discharge or new sexual partners and was not concerned about STDs.   Physical Exam   Triage Vital Signs: ED Triage Vitals  Encounter Vitals Group     BP 10/19/23 0839 107/67     Systolic BP Percentile --      Diastolic BP Percentile --      Pulse Rate 10/19/23 0839 61     Resp 10/19/23 0839 16     Temp 10/19/23 0839 97.9 F (36.6 C)     Temp Source 10/19/23 0839 Oral     SpO2 10/19/23 0839 98 %     Weight --      Height 10/19/23 0840 5\' 7"  (1.702 m)     Head Circumference --      Peak Flow --      Pain Score 10/19/23 0840 8     Pain Loc --      Pain Education --      Exclude from Growth Chart --     Most recent vital signs: Vitals:   10/19/23 0839  BP: 107/67  Pulse: 61  Resp: 16  Temp: 97.9 F (36.6 C)  SpO2: 98%     General: Awake, no distress.  CV:  Good peripheral perfusion.  Resp:  Normal effort.  Abd:  No distention.  Pain in the right lower quadrant. Other:  IUD strings are visualized.  No cervical motion tenderness no adnexal tenderness   ED Results / Procedures / Treatments   Labs (all labs ordered are listed, but only abnormal results are displayed) Labs Reviewed  CBC  LIPASE, BLOOD  COMPREHENSIVE METABOLIC PANEL  URINALYSIS, ROUTINE W REFLEX MICROSCOPIC  POC URINE PREG, ED     RADIOLOGY I have reviewed the CT personally interpreted and no evidence of  kidney stones.     PROCEDURES:  Critical Care performed: No  Procedures   MEDICATIONS ORDERED IN ED: Medications  fentaNYL  (SUBLIMAZE ) injection 50 mcg (50 mcg Intravenous Given 10/19/23 0935)  ondansetron  (ZOFRAN ) injection 4 mg (4 mg Intravenous Given 10/19/23 0935)  iohexol  (OMNIPAQUE ) 300 MG/ML solution 100 mL (100 mLs Intravenous Contrast Given 10/19/23 0948)     IMPRESSION / MDM / ASSESSMENT AND PLAN / ED COURSE  I reviewed the triage vital signs and the nursing notes.   Patient's presentation is most consistent with acute presentation with potential threat to life or bodily function.   Patient comes in with right lower quadrant pain.  Differential includes appendicitis, ovarian cyst, IUD malposition.  Patient's pelvic exam shows IUD strings are in place.  We discussed CT versus ultrasound.  Will suspicion for PID will proceed with CT imaging to rule out appendicitis.  Patient given some IV fentanyl  IV Zofran   CBC is reassuring.  Pregnancy test was negative. Patient given some fentanyl , Zofran  she reports resolution of symptoms  IMPRESSION: No CT evidence of acute abdominal/pelvic process. Normal appendix.   BV is positive.  Urine without evidence of UTI.  Patient reports feeling better.  We discussed her CT results.  No adnexal cyst noted in her pain was not very low in nature and pelvic exam was normal so seems less likely related to a cyst.  We discussed that this time I do not see any acute pathology.  We discussed that sometimes IUDs can get slightly malposition and given its time for her to be removed anyways that she can follow-up with OB if the pain is continuing.  But at this time we discussed Tylenol, ibuprofen for symptom management.  We also discussed Flagyl  not drinking on it, Zofran  for the BV.  She expressed understanding felt comfortable with discharge home     FINAL CLINICAL IMPRESSION(S) / ED DIAGNOSES   Final diagnoses:  Right lower quadrant abdominal  pain  Bacterial vaginosis     Rx / DC Orders   ED Discharge Orders     None        Note:  This document was prepared using Dragon voice recognition software and may include unintentional dictation errors.   Lubertha Rush, MD 10/19/23 [phone]

## 2023-10-19 NOTE — Discharge Instructions (Signed)
 You can take Tylenol 1 g every 8 hours, ibuprofen 600 every 8 hours with food to help with pain.  Return to the ER if develop worsening symptoms or any other concerns

## 2023-10-19 NOTE — ED Notes (Signed)
 New ice pack applied to R-AC post IV contrast infiltration. Skin normal in color, cool to touch.

## 2023-10-19 NOTE — ED Triage Notes (Signed)
 Pt reports right lower abd pain that started yesterday, reports lack of appetite and nausea, denies fever, denies pain with urination, unknown lmp due to iud

## 2023-11-14 ENCOUNTER — Telehealth: Payer: Self-pay | Admitting: Diagnostic Neuroimaging

## 2023-11-14 ENCOUNTER — Ambulatory Visit: Payer: Medicaid Other | Admitting: Diagnostic Neuroimaging

## 2023-11-14 NOTE — Telephone Encounter (Signed)
 Pt rescheduled due to a family conflict

## 2023-12-13 ENCOUNTER — Encounter: Payer: Self-pay | Admitting: Diagnostic Neuroimaging

## 2023-12-13 ENCOUNTER — Ambulatory Visit: Payer: Medicaid Other | Admitting: Diagnostic Neuroimaging

## 2023-12-13 VITALS — BP 127/74 | HR 74 | Ht 67.0 in | Wt 275.4 lb

## 2023-12-13 DIAGNOSIS — Z6841 Body Mass Index (BMI) 40.0 and over, adult: Secondary | ICD-10-CM | POA: Diagnosis not present

## 2023-12-13 DIAGNOSIS — G932 Benign intracranial hypertension: Secondary | ICD-10-CM | POA: Diagnosis not present

## 2023-12-13 MED ORDER — ACETAZOLAMIDE ER 500 MG PO CP12: ORAL_CAPSULE | ORAL | 4 refills | Status: AC

## 2023-12-13 NOTE — Progress Notes (Signed)
 [facility]  PATIENT: Brianna Sutton DOB: Sep 01, 2001  REFERRING CLINICIAN: Pediatrics, Blima Rich HISTORY FROM: PATIENT  REASON FOR VISIT: NEW CONSULT   HISTORICAL  CHIEF COMPLAINT:  Chief Complaint  Patient presents with   Follow-up    Pt in room 6. Boyfriend in room. Here for IIH follow up. Pt still having tingling in hands and bilateral tingling from knee down.    HISTORY OF PRESENT ILLNESS:   UPDATE (12/13/23, VRP): Since last visit, some sxs are better. Had IUD taken out in Jan 2025. Eye exam in Nov 2024 was better. HA are resolved.  UPDATE (05/09/23, VRP): Since last visit, increased acetazolamide in May 2024; symptoms overall improved, but not totally resolved. Eye exam in June was slightly worse. Trying to walk and exercise.  PRIOR HPI (12/23/22, VRP): 23 year old female here for evaluation of papilledema.  Patient had routine eye exam in December 2023, and papilledema was noted.  She was referred to retina specialist for further evaluation.  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  MRI brain and orbits were ordered.  Patient has had some issues with weight gain in the last 3 months.  She has had some episodes of transient visual obscuration with colorful lights, with some episodes triggered by straining with weight lifting.  Sometimes associated with severe pressure explosion sensation in her head. She is also had some left-sided ringing in your ears where she can hear her heartbeat.  All the symptoms started in the last 3 months.  Patient had pregnancy and delivery in 2020 with epidural.  Now patient has IUD for contraception.    REVIEW OF SYSTEMS: Full 14 system review of systems performed and negative with exception of: as per HPI.  ALLERGIES: No Known Allergies  HOME MEDICATIONS: Outpatient Medications Prior to Visit  Medication Sig Dispense Refill   acetaminophen (TYLENOL) 325 MG tablet Take 325 mg by mouth every 4 (four) hours as needed.      acetaZOLAMIDE ER (DIAMOX) 500 MG capsule Take 1 capsule (500 mg total) by mouth every morning AND 2 capsules (1,000 mg total) every evening. 90 capsule 12   acetaZOLAMIDE (DIAMOX) 250 MG tablet Take 2 tablets (500 mg total) by mouth 3 (three) times daily. (Patient not taking: Reported on 12/13/2023) 180 tablet 12   No facility-administered medications prior to visit.    PAST MEDICAL HISTORY: Past Medical History:  Diagnosis Date   Intracranial hypertension     PAST SURGICAL HISTORY: History reviewed. No pertinent surgical history.  FAMILY HISTORY: History reviewed. No pertinent family history.  SOCIAL HISTORY: Social History   Socioeconomic History   Marital status: Single    Spouse name: Not on file   Number of children: 1   Years of education: Not on file   Highest education level: Not on file  Occupational History   Not on file  Tobacco Use   Smoking status: Never    Passive exposure: Never   Smokeless tobacco: Not on file  Vaping Use   Vaping status: Never Used  Substance and Sexual Activity   Alcohol use: No   Drug use: Never   Sexual activity: Yes    Birth control/protection: I.U.D.  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 Wt Readings from Last 3 Encounters:  12/13/23 275 lb 6.4 oz (124.9 kg)  10/19/23 275 lb (124.7 kg)  05/27/23 284 lb 4.8 oz (129 kg)   Patient is in no distress; well developed, nourished  and groomed; neck is supple  CARDIOVASCULAR: Examination of carotid arteries is normal; no carotid bruits Regular rate and rhythm, no murmurs Examination of peripheral vascular system by observation and palpation is normal  EYES: Ophthalmoscopic exam of optic discs and posterior segments is normal; no papilledema or hemorrhages No results found.  MUSCULOSKELETAL: Gait, strength, tone, movements noted in Neurologic exam below  NEUROLOGIC: MENTAL STATUS:      No data to display         awake, alert, oriented to person, place and time recent and remote memory intact normal attention and concentration language fluent, comprehension intact, naming intact fund of knowledge appropriate  CRANIAL NERVE:  2nd - MILD BILATERAL PAPILLEDEMA 2nd, 3rd, 4th, 6th - pupils equal and reactive to light, visual fields full to confrontation, extraocular muscles intact, no nystagmus 5th - facial sensation symmetric 7th - facial strength symmetric 8th - hearing intact 9th - palate elevates symmetrically, uvula midline 11th - shoulder shrug symmetric 12th - tongue protrusion midline  MOTOR:  normal bulk and tone, full strength in the BUE, BLE  SENSORY:  normal and symmetric to light touch, temperature, vibration  COORDINATION:  finger-nose-finger, fine finger movements normal  REFLEXES:  deep tendon reflexes present and symmetric  GAIT/STATION:  narrow based gait     DIAGNOSTIC DATA (LABS, IMAGING, TESTING) - I reviewed patient records, labs, notes, testing and imaging myself where available.  Lab Results  Component Value Date   WBC 9.5 10/19/2023   HGB 14.2 10/19/2023   HCT 44.4 10/19/2023   MCV 89.2 10/19/2023   PLT 290 10/19/2023      Component Value Date/Time   NA 140 10/19/2023 0842   NA 138 07/25/2013 0607   K 3.6 10/19/2023 0842   K 3.8 07/25/2013 0607   CL 109 10/19/2023 0842   CL 107 07/25/2013 0607   CO2 21 (L) 10/19/2023 0842   CO2 24 07/25/2013 0607    GLUCOSE 108 (H) 10/19/2023 0842   GLUCOSE 97 07/25/2013 0607   BUN 9 10/19/2023 0842   BUN 10 07/25/2013 0607   CREATININE 0.78 10/19/2023 0842   CREATININE 0.70 05/27/2023 1451   CALCIUM 9.6 10/19/2023 0842   CALCIUM 9.2 07/25/2013 0607   PROT 8.0 10/19/2023 0842   PROT 7.4 07/25/2013 0607   ALBUMIN 4.6 10/19/2023 0842   ALBUMIN 4.1 07/25/2013 0607   AST 24 10/19/2023 0842   AST 16 07/25/2013 0607   ALT 31 10/19/2023 0842   ALT 17 07/25/2013 0607   ALKPHOS 80 10/19/2023 0842   ALKPHOS 158 07/25/2013 0607   BILITOT 1.0 10/19/2023 0842   BILITOT 0.5 07/25/2013 0607   GFRNONAA >60 10/19/2023 0842   GFRAA NOT CALCULATED 05/28/2017 1130   Lab Results  Component Value Date   CHOL 195 05/27/2023   HDL 39 (L) 05/27/2023   LDLCALC 132 (H) 05/27/2023   TRIG 127 05/27/2023  CHOLHDL 5.0 (H) 05/27/2023   Lab Results  Component Value Date   HGBA1C 5.5 05/27/2023   No results found for: "VITAMINB12" Lab Results  Component Value Date   TSH 2.02 05/27/2023    11/23/22 MRI brain / orbits -Intra-ocular protrusion of the optic nerve heads bilaterally. Vertical tortuosity of the intraorbital optic nerves with mild CSF prominence in the bilateral optic nerve sheaths. This constellation of findings suggests idiopathic intracranial hypertension (pseudotumor cerebri). -Additionally, narrowing of the bilateral distal transverse dural venous sinuses is questioned. This finding can also be seen in the setting of idiopathic intracranial hypertension. Consider an MR venogram of the head for further evaluation. 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 Otherwise unremarkable MRI appearance of the brain itself.  LP 1. Technically successful fluoroscopically guided lumbar puncture. 2. Mildly elevated opening CSF pressure of 23 cm water.    ASSESSMENT AND PLAN  23 y.o. year old female here with:  Dx:  1. IIH (idiopathic intracranial hypertension)   2.  BMI 40.0-44.9, adult (HCC)     PLAN:  idiopathic intracranial hypertension (pseudotumor cerebri)  - continue acetazolamide ER; 500mg  in AM and 1000mg  in PM - check sleep study (BMI 40, daytime sleepiness, fatigue; eval for OSA) - mild gradual weight loss encouraged; consider GLP1 agonist medication - follow up with Dr. Sherryll Burger for serial exams  Meds ordered this encounter  Medications   acetaZOLAMIDE ER (DIAMOX) 500 MG capsule    Sig: Take 1 capsule (500 mg total) by mouth every morning AND 2 capsules (1,000 mg total) every evening.    Dispense:  270 capsule    Refill:  4   Return in about 6 months (around 06/14/2024) for MyChart visit (15 min).    Suanne Marker, MD 12/13/2023, 5:00 PM Certified in Neurology, Neurophysiology and Neuroimaging  [facility] [address] [place], [place] [address] [phone]

## 2023-12-27 ENCOUNTER — Ambulatory Visit (INDEPENDENT_AMBULATORY_CARE_PROVIDER_SITE_OTHER): Admitting: Neurology

## 2023-12-27 ENCOUNTER — Encounter: Payer: Self-pay | Admitting: Neurology

## 2023-12-27 VITALS — BP 110/60 | HR 64 | Ht 67.0 in | Wt 276.0 lb

## 2023-12-27 DIAGNOSIS — G932 Benign intracranial hypertension: Secondary | ICD-10-CM

## 2023-12-27 DIAGNOSIS — G4719 Other hypersomnia: Secondary | ICD-10-CM | POA: Diagnosis not present

## 2023-12-27 DIAGNOSIS — R0681 Apnea, not elsewhere classified: Secondary | ICD-10-CM | POA: Diagnosis not present

## 2023-12-27 DIAGNOSIS — R0683 Snoring: Secondary | ICD-10-CM | POA: Diagnosis not present

## 2023-12-27 DIAGNOSIS — Z9189 Other specified personal risk factors, not elsewhere classified: Secondary | ICD-10-CM | POA: Diagnosis not present

## 2023-12-27 DIAGNOSIS — R0689 Other abnormalities of breathing: Secondary | ICD-10-CM

## 2023-12-27 DIAGNOSIS — R519 Headache, unspecified: Secondary | ICD-10-CM

## 2023-12-27 NOTE — Progress Notes (Signed)
 Subjective:    Patient ID: Brianna Sutton is a 23 y.o. female.  HPI    Huston Foley, MD, PhD [facility] [address] [address] [place], [place] [address]  Dear Satira Sark,    I saw your patient, Brianna Sutton, upon your kind request in my sleep clinic today for initial consultation of her sleep disorder, in particular, concern for underlying obstructive sleep apnea.  The patient is accompanied by her husband, Daphine Deutscher, today.  She missed an appointment on 06/21/2023.  As you know, Brianna Sutton is a 23 year old female with an underlying medical history of IIH, cholelithiasis with status post cholecystectomy, and severe obesity with a BMI of over 40, who reports snoring and excessive daytime somnolence.  Her Epworth sleepiness score is 8 out of 24, fatigue severity score is 40 out of 63.  She has woken up with a sense of gasping for air and her husband has noted pauses in her breathing while she is asleep.  She wakes up with a headache frequently, about 4 or 5 times per week.  She has had trouble losing weight.  She complains of lack of energy.  I reviewed your office notes from 05/09/2023, as well as 12/13/2023.  Has no known family history of sleep apnea.  She currently does not work.  She lives with her husband and 57-year-old daughter.  They have 1 dog in the household.  The dog does not sleep in their bedroom.  They do not have a TV in their bedroom.  Bedtime is generally between 11 and midnight and rise time around 7.  She has no nightly nocturia.  She drinks limited caffeine in the form of coffee, usually 1 cup in the morning and occasional tea.  She tries to hydrate well with water but is not sure if she actually gets 8 cups of water per day on average.  She is a non-smoker of cigarettes but smokes THC once daily in the evening, she reports that it helps her sleep.  She is working on weight loss and has lost a few pounds.  Her Past Medical History Is  Significant For: Past Medical History:  Diagnosis Date   Intracranial hypertension     Her Past Surgical History Is Significant For: Past Surgical History:  Procedure Laterality Date   CHOLECYSTECTOMY     LUMBAR PUNCTURE  09/2023    Her Family History Is Significant For: Family History  Problem Relation Age of Onset   Sleep apnea Neg Hx    Pseudotumor cerebri Neg Hx     Her Social History Is Significant For: Social History   Socioeconomic History   Marital status: Single    Spouse name: Not on file   Number of children: 1   Years of education: Not on file   Highest education level: Not on file  Occupational History   Not on file  Tobacco Use   Smoking status: Never    Passive exposure: Never   Smokeless tobacco: Never  Vaping Use   Vaping status: Never Used  Substance and Sexual Activity   Alcohol use: No   Drug use: Never   Sexual activity: Yes    Birth control/protection: None  Other Topics Concern   Not on file  Social History Narrative   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   [DISCONTINUED] acetaminophen (TYLENOL) 325 MG tablet Take 325 mg by mouth every 4 (four) hours as needed. (Patient not taking: Reported on 12/27/2023)   No facility-administered encounter medications on file as of 12/27/2023.  :   Review of Systems:  Out of a complete 14 point review of systems, all are reviewed and  negative with the exception of these symptoms as listed below:   Review of Systems  Neurological:        Patient is here with her spouse for sleep consult. She has IIH, fatigue, and her spouse states that she snores and stops breathing while sleeping. She has never had a sleep study before. She has no family history of sleep apnea. ESS 8 FSS 40    Objective:  Neurological Exam  Physical Exam Physical Examination:   Vitals:   12/27/23 1126  BP: 110/60  Pulse: 64   General Examination: The patient is a very pleasant 23 y.o. female in no acute distress. She appears well-developed and well-nourished and well groomed.   HEENT: Normocephalic, atraumatic, pupils are equal, round and reactive to light, extraocular tracking is good without limitation to gaze excursion or nystagmus noted. Hearing is grossly intact. Face is symmetric with normal facial animation. Speech is clear with no dysarthria noted. There is no hypophonia. There is no lip, neck/head, jaw or voice tremor. Neck is supple with full range of passive and active motion. There are no carotid bruits on auscultation. Oropharynx exam reveals: mild mouth dryness, good dental hygiene and mild airway crowding, due to ileus.  Tonsils on the smaller side, Mallampati class II, smaller uvula.  Neck circumference 15 and 1 eighths inches, minimal overbite noted.  Tongue protrudes centrally and palate elevates symmetrically.  Chest: Clear to auscultation without wheezing, rhonchi or crackles noted.  Heart: S1+S2+0, regular and normal without murmurs, rubs or gallops noted.   Abdomen: Soft, non-tender and non-distended.  Extremities: There is no pitting edema in the distal lower extremities bilaterally.   Skin: Warm and dry without trophic changes noted.   Musculoskeletal: exam reveals no obvious joint deformities.   Neurologically:  Mental status: The patient is awake, alert and oriented in all 4 spheres. Her immediate and remote memory,  attention, language skills and fund of knowledge are appropriate. There is no evidence of aphasia, agnosia, apraxia or anomia. Speech is clear with normal prosody and enunciation. Thought process is linear. Mood is normal and affect is normal.  Cranial nerves II - XII are as described above under HEENT exam.  Motor exam: Normal bulk, strength and tone is noted. There is no obvious action or resting tremor.  Fine motor skills and coordination: grossly intact.  Cerebellar testing: No dysmetria or intention tremor. There is no truncal or gait ataxia.  Sensory exam: intact to light touch in the upper and lower extremities.  Gait, station and balance: She stands easily. No veering to one side is noted. No leaning to one side is noted. Posture is age-appropriate and stance is narrow based.  Gait shows normal stride length and normal pace. No problems turning are noted.   Assessment and Plan:  In summary, Brianna Sutton is a very pleasant 23 y.o.-year old female 23 year old female with an underlying medical history of IIH, cholelithiasis with status post cholecystectomy, and severe obesity with a BMI of over 40, whose history and physical exam are concerning for sleep disordered breathing, particularly obstructive sleep apnea (OSA). A laboratory attended sleep study is typically considered "gold standard" for evaluation of sleep disordered breathing.   I had a long chat with the patient and Daphine Deutscher about my findings and the diagnosis of sleep apnea, particularly OSA, its prognosis and treatment options. We talked about medical/conservative treatments, surgical interventions and non-pharmacological approaches for symptom control. I explained, in particular, the risks and ramifications of untreated moderate to severe OSA, especially with respect to developing cardiovascular disease down the road, including congestive heart failure (CHF), difficult to treat hypertension, cardiac arrhythmias (particularly  A-fib), neurovascular complications including TIA, stroke and dementia. Even type 2 diabetes has, in part, been linked to untreated OSA. Symptoms of untreated OSA may include (but may not be limited to) daytime sleepiness, nocturia (i.e. frequent nighttime urination), memory problems, mood irritability and suboptimally controlled or worsening mood disorder such as depression and/or anxiety, lack of energy, lack of motivation, physical discomfort, as well as recurrent headaches, especially morning or nocturnal headaches. We talked about the importance of maintaining a healthy lifestyle and striving for healthy weight.  The importance of complete THC smoking cessation was also addressed.  In addition, we talked about the importance of striving for and maintaining good sleep hygiene. I recommended a sleep study at this time. I outlined the differences between a laboratory attended sleep study which is considered more comprehensive and accurate over the option of a home sleep test (HST); the latter may lead to underestimation of sleep disordered breathing in some instances and does not help with diagnosing upper airway resistance syndrome and is not accurate enough to diagnose primary central sleep apnea typically. I outlined possible surgical and non-surgical treatment options of OSA, including the use of a positive airway pressure (PAP) device (i.e. CPAP, AutoPAP/APAP or BiPAP in certain circumstances), a custom-made dental device (aka oral appliance, which would require a referral to a specialist dentist or orthodontist typically, and is generally speaking not considered for patients with full dentures or edentulous state), upper airway surgical options, such as traditional UPPP (which is not considered a first-line treatment) or the Inspire device (hypoglossal nerve stimulator, which would involve a referral for consultation with an ENT surgeon, after careful selection, following inclusion criteria - also not  first-line treatment). I explained the PAP treatment option to the patient in detail, as this is generally considered first-line treatment.  The patient indicated that she would be willing to try PAP therapy, if the need arises. I explained the importance of being compliant with PAP treatment, not only for insurance purposes but primarily to improve patient's symptoms symptoms, and for the patient's long term health benefit, including to reduce Her cardiovascular risks longer-term.    We will pick up our discussion about the next steps and treatment options after testing.  We will keep her posted as to the test results by phone call and/or MyChart messaging where possible.  We will plan to follow-up in sleep clinic accordingly as well.  I answered all their questions today and the patient and her husband were in agreement.   I encouraged them to call  with any interim questions, concerns, problems or updates or email [place] through MyChart.  Generally speaking, sleep test authorizations may take up to 2 weeks, sometimes less, sometimes longer, the patient is encouraged to get in touch with [place] if they do not hear back from the sleep lab staff directly within the next 2 weeks.  Thank you very much for allowing me to participate in the care of this nice patient. If I can be of any further assistance to you please do not hesitate to talk to me.  Sincerely,   Huston Foley, MD, PhD

## 2023-12-27 NOTE — Patient Instructions (Signed)

## 2024-01-05 ENCOUNTER — Telehealth: Payer: Self-pay | Admitting: Neurology

## 2024-01-05 NOTE — Telephone Encounter (Signed)
 [employer]BS [place]: [id number] (exp. 01/05/24 to 03/04/24) & [employer] [employer] pending- [employer] denied the [place]PSG

## 2024-01-09 NOTE — Telephone Encounter (Signed)
 [employer] pending for the HST.

## 2024-01-10 NOTE — Telephone Encounter (Signed)
 HST [employer] no auth req via fax

## 2024-01-24 ENCOUNTER — Ambulatory Visit: Admitting: Family Medicine

## 2024-01-24 VITALS — BP 110/70 | HR 86 | Temp 98.2°F | Resp 16 | Ht 67.0 in | Wt 285.0 lb

## 2024-01-24 DIAGNOSIS — L309 Dermatitis, unspecified: Secondary | ICD-10-CM | POA: Diagnosis not present

## 2024-01-24 MED ORDER — LEVOCETIRIZINE DIHYDROCHLORIDE 5 MG PO TABS
5.0000 mg | ORAL_TABLET | Freq: Every evening | ORAL | 1 refills | Status: AC
Start: 2024-01-24 — End: ?

## 2024-01-24 MED ORDER — TRIAMCINOLONE ACETONIDE 0.1 % EX OINT: 1.0000 | TOPICAL_OINTMENT | Freq: Two times a day (BID) | CUTANEOUS | 0 refills | Status: AC

## 2024-01-24 MED ORDER — PREDNISONE 10 MG (21) PO TBPK
ORAL_TABLET | ORAL | 0 refills | Status: AC
Start: 2024-01-24 — End: ?

## 2024-01-24 NOTE — Progress Notes (Signed)
 Patient ID: Brianna Sutton, female    DOB: Oct 12, 2000, 23 y.o.   MRN: [id number]  PCP: Quinton Buckler, FNP  Chief Complaint  Patient presents with   Rash    Right arm. Symptoms: itchy Frequency: x 4 months    Subjective:   Brianna Sutton is a 23 y.o. female, presents to clinic with CC of the following:  HPI  Rash to arms right arm wrose onset a few months ago seems to gradually getting worse and spreading    Patient Active Problem List   Diagnosis Date Noted   IIH (idiopathic intracranial hypertension) 05/27/2023   Class 3 severe obesity due to excess calories with serious comorbidity and body mass index (BMI) of 45.0 to 49.9 in adult [facility]) 05/27/2023      Current Outpatient Medications:    acetaZOLAMIDE  ER (DIAMOX ) 500 MG capsule, Take 1 capsule (500 mg total) by mouth every morning AND 2 capsules (1,000 mg total) every evening., Disp: 270 capsule, Rfl: 4   No Known Allergies   Social History   Tobacco Use   Smoking status: Never    Passive exposure: Never   Smokeless tobacco: Never  Vaping Use   Vaping status: Never Used  Substance Use Topics   Alcohol use: No   Drug use: Never      Chart Review Today: I personally reviewed active problem list, medication list, allergies, family history, social history, health maintenance, notes from last encounter, lab results, imaging with the patient/caregiver today.   Review of Systems  Constitutional: Negative.   HENT: Negative.    Eyes: Negative.   Respiratory: Negative.    Cardiovascular: Negative.   Gastrointestinal: Negative.   Endocrine: Negative.   Genitourinary: Negative.   Musculoskeletal: Negative.   Skin: Negative.   Allergic/Immunologic: Negative.   Neurological: Negative.   Hematological: Negative.   Psychiatric/Behavioral: Negative.    All other systems reviewed and are negative.      Objective:   Vitals:   01/24/24 1548  BP: 110/70  Pulse: 86  Resp: 16  Temp: 98.2  F (36.8 C)  TempSrc: Oral  SpO2: 97%  Weight: 285 lb (129.3 kg)  Height: 5\' 7"  (1.702 m)    Body mass index is 44.64 kg/m.  Physical Exam Vitals and nursing note reviewed.  Constitutional:      General: She is not in acute distress.    Appearance: Normal appearance. She is well-developed. She is obese. She is not ill-appearing, toxic-appearing or diaphoretic.  HENT:     Head: Normocephalic and atraumatic.     Nose: Nose normal.  Eyes:     General:        Right eye: No discharge.        Left eye: No discharge.     Conjunctiva/sclera: Conjunctivae normal.  Neck:     Trachea: No tracheal deviation.  Cardiovascular:     Rate and Rhythm: Normal rate and regular rhythm.  Pulmonary:     Effort: Pulmonary effort is normal. No respiratory distress.     Breath sounds: No stridor.  Musculoskeletal:        General: Normal range of motion.  Skin:    General: Skin is warm and dry.     Findings: Rash present.  Neurological:     Mental Status: She is alert.     Motor: No abnormal muscle tone.     Coordination: Coordination normal.  Psychiatric:        Behavior: Behavior  normal.      Results for orders placed or performed during the hospital encounter of 10/19/23  Lipase, blood   Collection Time: 10/19/23  8:42 AM  Result Value Ref Range   Lipase 46 11 - 51 U/L  Comprehensive metabolic panel   Collection Time: 10/19/23  8:42 AM  Result Value Ref Range   Sodium 140 135 - 145 mmol/L   Potassium 3.6 3.5 - 5.1 mmol/L   Chloride 109 98 - 111 mmol/L   CO2 21 (L) 22 - 32 mmol/L   Glucose, Bld 108 (H) 70 - 99 mg/dL   BUN 9 6 - 20 mg/dL   Creatinine, Ser 6.04 0.44 - 1.00 mg/dL   Calcium 9.6 8.9 - 54.0 mg/dL   Total Protein 8.0 6.5 - 8.1 g/dL   Albumin 4.6 3.5 - 5.0 g/dL   AST 24 15 - 41 U/L   ALT 31 0 - 44 U/L   Alkaline Phosphatase 80 38 - 126 U/L   Total Bilirubin 1.0 0.0 - 1.2 mg/dL   GFR, Estimated >98 >11 mL/min   Anion gap 10 5 - 15  CBC   Collection Time: 10/19/23   8:42 AM  Result Value Ref Range   WBC 9.5 4.0 - 10.5 K/uL   RBC 4.98 3.87 - 5.11 MIL/uL   Hemoglobin 14.2 12.0 - 15.0 g/dL   HCT 91.4 78.2 - 95.6 %   MCV 89.2 80.0 - 100.0 fL   MCH 28.5 26.0 - 34.0 pg   MCHC 32.0 30.0 - 36.0 g/dL   RDW 21.3 08.6 - 57.8 %   Platelets 290 150 - 400 K/uL   nRBC 0.0 0.0 - 0.2 %  POC urine preg, ED   Collection Time: 10/19/23  8:43 AM  Result Value Ref Range   Preg Test, Ur NEGATIVE NEGATIVE  Urinalysis, Routine w reflex microscopic -Urine, Clean Catch   Collection Time: 10/19/23  8:48 AM  Result Value Ref Range   Color, Urine YELLOW (A) YELLOW   APPearance CLOUDY (A) CLEAR   Specific Gravity, Urine 1.023 1.005 - 1.030   pH 5.0 5.0 - 8.0   Glucose, UA NEGATIVE NEGATIVE mg/dL   Hgb urine dipstick NEGATIVE NEGATIVE   Bilirubin Urine NEGATIVE NEGATIVE   Ketones, ur NEGATIVE NEGATIVE mg/dL   Protein, ur NEGATIVE NEGATIVE mg/dL   Nitrite NEGATIVE NEGATIVE   Leukocytes,Ua SMALL (A) NEGATIVE   RBC / HPF 0-5 0 - 5 RBC/hpf   WBC, UA 11-20 0 - 5 WBC/hpf   Bacteria, UA NONE SEEN NONE SEEN   Squamous Epithelial / HPF >50 0 - 5 /HPF   Mucus PRESENT    Sperm, UA PRESENT   Chlamydia/NGC rt PCR ([facility] only)   Collection Time: 10/19/23  9:40 AM  Result Value Ref Range   Specimen source GC/Chlam ENDOCERVICAL    Chlamydia Tr NOT DETECTED NOT DETECTED   N gonorrhoeae NOT DETECTED NOT DETECTED  Wet prep, genital   Collection Time: 10/19/23  9:41 AM  Result Value Ref Range   Yeast Wet Prep HPF POC NONE SEEN NONE SEEN   Trich, Wet Prep NONE SEEN NONE SEEN   Clue Cells Wet Prep HPF POC PRESENT (A) NONE SEEN   WBC, Wet Prep HPF POC <10 <10   Sperm NONE SEEN        Assessment & Plan:   1. Dermatitis (Primary) Pt endorses red spreading itchy rash, tx with steroid taper, if topical meds and antihistamines plus moisturizing do not help it  get controlled. - predniSONE  (STERAPRED UNI-PAK 21 TAB) 10 MG (21) TBPK tablet; Take as directed on package.  (60 mg po on  day 1, 50 mg po on day 2...)  Dispense: 21 tablet; Refill: 0 - triamcinolone  ointment (KENALOG ) 0.1 %; Apply 1 Application topically 2 (two) times daily.  Dispense: 30 g; Refill: 0 - levocetirizine (XYZAL ) 5 MG tablet; Take 1 tablet (5 mg total) by mouth every evening.  Dispense: 90 tablet; Refill: 1  Plan for Pt on AVS: Start with the daily allergy pill (antihistamine - can help with rash and itching) Moisturize with vaseline (petroleum jelly) and apply cortisone 10 or over the counter hydrocortisone 1% cream to extremities that are itching and I would apply the prescription steroid only to the raised thickened skin areas (right forearm).  If your rash is spreading rapidly I would start the oral steroids and please let me know.     Rash seems more like atopic dermatitis/eczema that an acute contact dermatitis or allergic rxn, surface area is not currently extensive and spread is not rapid so at this time I feel she can try more conservative mgmt and if any worsening then try the systemic steroids - discussed with pt and all questions asked and answered  Return for As needed if not improving 1-2 weeks .    Adeline Hone, PA-C 01/24/24 4:18 PM

## 2024-01-24 NOTE — Patient Instructions (Signed)
 Start with the daily allergy pill (antihistamine - can help with rash and itching) Moisturize with vaseline (petroleum jelly) and apply cortisone 10 or over the counter hydrocortisone 1% cream to extremities that are itching and I would apply the prescription steroid only to the raised thickened skin areas (right forearm).  If your rash is spreading rapidly I would start the oral steroids and please let me know.

## 2024-01-31 ENCOUNTER — Ambulatory Visit: Admitting: Neurology

## 2024-01-31 DIAGNOSIS — R519 Headache, unspecified: Secondary | ICD-10-CM

## 2024-01-31 DIAGNOSIS — R0681 Apnea, not elsewhere classified: Secondary | ICD-10-CM

## 2024-01-31 DIAGNOSIS — R0689 Other abnormalities of breathing: Secondary | ICD-10-CM

## 2024-01-31 DIAGNOSIS — Z9189 Other specified personal risk factors, not elsewhere classified: Secondary | ICD-10-CM

## 2024-01-31 DIAGNOSIS — G4733 Obstructive sleep apnea (adult) (pediatric): Secondary | ICD-10-CM

## 2024-01-31 DIAGNOSIS — G932 Benign intracranial hypertension: Secondary | ICD-10-CM

## 2024-01-31 DIAGNOSIS — R0683 Snoring: Secondary | ICD-10-CM

## 2024-01-31 DIAGNOSIS — G4719 Other hypersomnia: Secondary | ICD-10-CM

## 2024-02-01 NOTE — Progress Notes (Signed)
 See procedure note.

## 2024-02-03 ENCOUNTER — Encounter: Payer: Self-pay | Admitting: Neurology

## 2024-02-03 DIAGNOSIS — R0683 Snoring: Secondary | ICD-10-CM

## 2024-02-03 NOTE — Procedures (Signed)
   [facility]  HOME SLEEP TEST (Watch PAT) REPORT  STUDY DATE: 01/31/2024  DOB: June 09, 2001  MRN: [id number]  ORDERING CLINICIAN: Debbra Fairy, MD, PhD   REFERRING CLINICIAN: Dr. Gwendloyn Lemming  CLINICAL INFORMATION/HISTORY: 23 year old female with an underlying medical history of IIH, cholelithiasis with status post cholecystectomy, and severe obesity with a BMI of over 40, who reports snoring and excessive daytime somnolence.    Epworth sleepiness score: 8/24.  BMI: 43.2 kg/m  FINDINGS:   Sleep Summary:   Total Recording Time (hours, min): 7 hours, 38 min  Total Sleep Time (hours, min):  6 hours, 20 min  Percent REM (%):    25.7%   Respiratory Indices:   Calculated pAHI (per hour):  3.3/hour         REM pAHI:    5.7/hour       NREM pAHI: 2.4/hour  Central pAHI: 0/hour  Oxygen Saturation Statistics:    Oxygen Saturation (%) Mean: 95%   Minimum oxygen saturation (%):                 87%   O2 Saturation Range (%): 87-98%    O2 Saturation (minutes) <=88%: 0.2 min  Pulse Rate Statistics:   Pulse Mean (bpm):    67/min    Pulse Range (53-105/min)   IMPRESSION: Primary snoring    RECOMMENDATION:  This home sleep test does not demonstrate any significant obstructive or central sleep disordered breathing with a total AHI of less than 5/hour. Her total AHI was 3.3/hour, O2 nadir of 87% without any significant time below 89% saturation of less than 1 minute for the night.  Snoring was detected, intermittently in the mild to moderate range. Treatment with a positive airway pressure device such as AutoPap or CPAP is not indicated based on this test. Snoring may improve with avoidance of the supine sleep position and weight loss (where clinically appropriate).   For disturbing snoring, an oral appliance through dentistry or orthodontics can be considered.  Other causes of the patient's symptoms, including circadian rhythm disturbances, an  underlying mood disorder, medication effect and/or an underlying medical problem cannot be ruled out based on this test. Clinical correlation is recommended.  The patient should be cautioned not to drive, work at heights, or operate dangerous or heavy equipment when tired or sleepy. Review and reiteration of good sleep hygiene measures should be pursued with any patient. The patient will be advised to follow up with her referring provider, who will be notified of the test results.   I certify that I have reviewed the raw data recording prior to the issuance of this report in accordance with the standards of the American Academy of Sleep Medicine (AASM).    INTERPRETING PHYSICIAN:   Debbra Fairy, MD, PhD Medical Director, [facility]) Diplomat, ABPN (Neurology and Sleep)   [facility] [address] [place], [place] [address] [phone]

## 2024-02-06 NOTE — Telephone Encounter (Signed)
Referral to dentistry signed.

## 2024-02-07 ENCOUNTER — Telehealth: Payer: Self-pay | Admitting: Neurology

## 2024-02-07 ENCOUNTER — Emergency Department: Admission: EM | Admit: 2024-02-07 | Discharge: 2024-02-07 | Discharge status: Against Medical Advice

## 2024-02-07 NOTE — ED Notes (Signed)
 No answer when called several times from lobby

## 2024-02-07 NOTE — Telephone Encounter (Signed)
 Dentistry referral faxed to [facility] (fax# [phone], phone# [phone])

## 2024-02-09 ENCOUNTER — Encounter: Payer: Self-pay | Admitting: Family Medicine

## 2024-02-09 ENCOUNTER — Encounter: Payer: Self-pay | Admitting: Diagnostic Neuroimaging

## 2024-03-21 ENCOUNTER — Emergency Department

## 2024-03-21 ENCOUNTER — Other Ambulatory Visit: Payer: Self-pay

## 2024-03-21 ENCOUNTER — Emergency Department
Admission: EM | Admit: 2024-03-21 | Discharge: 2024-03-22 | Disposition: A | Discharge status: Home / Self Care | Attending: Emergency Medicine | Admitting: Emergency Medicine

## 2024-03-21 DIAGNOSIS — O021 Missed abortion: Secondary | ICD-10-CM

## 2024-03-21 DIAGNOSIS — N939 Abnormal uterine and vaginal bleeding, unspecified: Secondary | ICD-10-CM | POA: Diagnosis present

## 2024-03-21 DIAGNOSIS — O209 Hemorrhage in early pregnancy, unspecified: Secondary | ICD-10-CM | POA: Diagnosis not present

## 2024-03-21 DIAGNOSIS — O039 Complete or unspecified spontaneous abortion without complication: Secondary | ICD-10-CM

## 2024-03-21 DIAGNOSIS — Z3A11 11 weeks gestation of pregnancy: Secondary | ICD-10-CM | POA: Diagnosis not present

## 2024-03-21 LAB — COMPREHENSIVE METABOLIC PANEL WITH GFR
ALT: 20 U/L (ref 0–44)
AST: 18 U/L (ref 15–41)
Albumin: 4 g/dL (ref 3.5–5.0)
Alkaline Phosphatase: 52 U/L (ref 38–126)
Anion gap: 10 (ref 5–15)
BUN: 11 mg/dL (ref 6–20)
CO2: 24 mmol/L (ref 22–32)
Calcium: 9.9 mg/dL (ref 8.9–10.3)
Chloride: 104 mmol/L (ref 98–111)
Creatinine, Ser: 0.51 mg/dL (ref 0.44–1.00)
GFR, Estimated: >60 mL/min (ref >60)
Glucose, Bld: 93 mg/dL (ref 70–99)
Potassium: 3.8 mmol/L (ref 3.5–5.1)
Sodium: 138 mmol/L (ref 135–145)
Total Bilirubin: 0.5 mg/dL (ref 0.0–1.2)
Total Protein: 7.4 g/dL (ref 6.5–8.1)

## 2024-03-21 LAB — URINALYSIS, ROUTINE W REFLEX MICROSCOPIC
Bilirubin Urine: NEGATIVE
Glucose, UA: NEGATIVE mg/dL
Ketones, ur: NEGATIVE mg/dL
Leukocytes,Ua: NEGATIVE
Nitrite: NEGATIVE
Protein, ur: NEGATIVE mg/dL
RBC / HPF: >50 RBC/hpf (ref 0–5)
Specific Gravity, Urine: 1.021 (ref 1.005–1.030)
pH: 6 (ref 5.0–8.0)

## 2024-03-21 LAB — HCG, QUANTITATIVE, PREGNANCY: hCG, Beta Chain, Quant, S: 8875 m[IU]/mL — ABNORMAL HIGH (ref <5)

## 2024-03-21 LAB — CBC
HCT: 38.8 % (ref 36.0–46.0)
Hemoglobin: 12.9 g/dL (ref 12.0–15.0)
MCH: 28.9 pg (ref 26.0–34.0)
MCHC: 33.2 g/dL (ref 30.0–36.0)
MCV: 87 fL (ref 80.0–100.0)
Platelets: 257 10*3/uL (ref 150–400)
RBC: 4.46 MIL/uL (ref 3.87–5.11)
RDW: 12.2 % (ref 11.5–15.5)
WBC: 10.7 10*3/uL — ABNORMAL HIGH (ref 4.0–10.5)
nRBC: 0 % (ref 0.0–0.2)

## 2024-03-21 LAB — POC URINE PREG, ED: Preg Test, Ur: POSITIVE — AB

## 2024-03-21 NOTE — ED Provider Notes (Signed)
 [facility] Provider Note    Event Date/Time   First MD Initiated Contact with Patient 03/21/24 2353     (approximate)   History   Vaginal Bleeding   HPI  Brianna Sutton is a 23 y.o. female with history of idiopathic intracranial hypertension, obesity, G2P1001 followed by [facility] who presents to the emergency department with complaints of vaginal bleeding.  States after sexual intercourse she noticed small amount of pink vaginal discharge that has now turned to dark brown.  Not passing clots or tissue.  No abdominal pain.  No urinary symptoms.  No fever.  She was last seen by her OB/GYN on 02/23/2024 and had an ultrasound that showed a single viable IUP with a crown rump length of 7.8 mm consistent with 6 weeks, 4 days with fetal heart tones of 140.   History provided by patient, significant other.    Past Medical History:  Diagnosis Date   Intracranial hypertension     Past Surgical History:  Procedure Laterality Date   CHOLECYSTECTOMY     LUMBAR PUNCTURE  09/2023    MEDICATIONS:  Prior to Admission medications   Medication Sig Start Date End Date Taking? Authorizing Provider  acetaZOLAMIDE  ER (DIAMOX ) 500 MG capsule Take 1 capsule (500 mg total) by mouth every morning AND 2 capsules (1,000 mg total) every evening. 12/13/23   Penumalli, Vikram R, MD  levocetirizine (XYZAL ) 5 MG tablet Take 1 tablet (5 mg total) by mouth every evening. 01/24/24   Tapia, Leisa, PA-C  predniSONE  (STERAPRED UNI-PAK 21 TAB) 10 MG (21) TBPK tablet Take as directed on package.  (60 mg po on day 1, 50 mg po on day 2...) 01/24/24   Tapia, Leisa, PA-C  triamcinolone  ointment (KENALOG ) 0.1 % Apply 1 Application topically 2 (two) times daily. 01/24/24   Adeline Hone, PA-C    Physical Exam   Triage Vital Signs: ED Triage Vitals  Encounter Vitals Group     BP 03/21/24 2033 126/67     Girls Systolic BP Percentile --      Girls Diastolic BP Percentile --       Boys Systolic BP Percentile --      Boys Diastolic BP Percentile --      Pulse Rate 03/21/24 2033 68     Resp 03/21/24 2033 19     Temp 03/21/24 2033 98.5 F (36.9 C)     Temp Source 03/21/24 2033 Oral     SpO2 03/21/24 2033 100 %     Weight 03/21/24 2033 285 lb (129.3 kg)     Height --      Head Circumference --      Peak Flow --      Pain Score 03/21/24 2036 0     Pain Loc --      Pain Education --      Exclude from Growth Chart --     Most recent vital signs: Vitals:   03/21/24 2033 03/22/24 0043  BP: 126/67 128/68  Pulse: 68 64  Resp: 19 18  Temp: 98.5 F (36.9 C) 98.3 F (36.8 C)  SpO2: 100% 98%    CONSTITUTIONAL: Alert, responds appropriately to questions. Well-appearing; well-nourished HEAD: Normocephalic, atraumatic EYES: Conjunctivae clear, pupils appear equal, sclera nonicteric ENT: normal nose; moist mucous membranes NECK: Supple, normal ROM CARD: RRR; S1 and S2 appreciated RESP: Normal chest excursion without splinting or tachypnea; breath sounds clear and equal bilaterally; no wheezes, no rhonchi, no rales, no  hypoxia or respiratory distress, speaking full sentences ABD/GI: Non-distended; soft, non-tender, no rebound, no guarding, no peritoneal signs BACK: The back appears normal EXT: Normal ROM in all joints; no deformity noted, no edema SKIN: Normal color for age and race; warm; no rash on exposed skin NEURO: Moves all extremities equally, normal speech PSYCH: The patient's mood and manner are appropriate.   ED Results / Procedures / Treatments   LABS: (all labs ordered are listed, but only abnormal results are displayed) Labs Reviewed  CBC - Abnormal; Notable for the following components:      Result Value   WBC 10.7 (*)    All other components within normal limits  HCG, QUANTITATIVE, PREGNANCY - Abnormal; Notable for the following components:   hCG, Beta Chain, Quant, S 8,875 (*)    All other components within normal limits  URINALYSIS,  ROUTINE W REFLEX MICROSCOPIC - Abnormal; Notable for the following components:   Color, Urine YELLOW (*)    APPearance HAZY (*)    Hgb urine dipstick LARGE (*)    Bacteria, UA RARE (*)    All other components within normal limits  POC URINE PREG, ED - Abnormal; Notable for the following components:   Preg Test, Ur POSITIVE (*)    All other components within normal limits  COMPREHENSIVE METABOLIC PANEL WITH GFR     EKG:   RADIOLOGY: My personal review and interpretation of imaging: Ultrasound shows no fetal cardiac activity.  I have personally reviewed all radiology reports.   US  OB LESS THAN 14 WEEKS WITH OB TRANSVAGINAL Addendum Date: 03/21/2024 ** ADDENDUM #1 ** ADDENDUM: Critical Value/emergent results were called by telephone at the time of interpretation on 03/21/2024 at 2255 hrs to provider Dr Hendrick Locke, who verbally acknowledged these results. ---------------------------------------------------- Electronically signed by: Zadie Herter MD 03/21/2024 11:00 PM EDT RP Workstation: [id number]   Result Date: 03/21/2024 ** ORIGINAL REPORT ** EXAM: ULTRASOUND FIRST TRIMESTER CLINICAL HISTORY: Bleeding TECHNIQUE: Transvaginal and Transabdominal first trimester obstetric pelvic duplex ultrasound was performed with real-time imaging, color flow Doppler imaging, and spectral analysis. COMPARISON: None provided. FINDINGS: UTERUS: No focal myometrial mass. GESTATIONAL SAC(S): Single normal appearing gestational sac. No subchorionic hemorrhage. YOLK SAC: No yolk sac. EMBRYO(<11WK) /FETUS(>=11WK): Single embryo measuring 13.6 mm, corresponding to 7 weeks and 4 days. CROWN RUMP LENGTH: 13.6 mm RATE OF CARDIAC ACTIVITY: No cardiac activity. RIGHT OVARY: Unremarkable. LEFT OVARY: Unremarkable. FREE FLUID: Trace pelvic fluid. MEASUREMENTS ESTIMATED GESTATIONAL AGE BY CURRENT ULTRASOUND: 7 weeks and 4 days IMPRESSION: 1. Single intrauterine gestation without cardiac activity. Findings are compatible with  failed pregnancy. Electronically signed by: Zadie Herter MD 03/21/2024 10:54 PM EDT RP Workstation: [id number]     PROCEDURES:  Critical Care performed: No    Procedures    IMPRESSION / MDM / ASSESSMENT AND PLAN / ED COURSE  I reviewed the triage vital signs and the nursing notes.    Patient here with vaginal bleeding in pregnancy.    DIFFERENTIAL DIAGNOSIS (includes but not limited to):   Miscarriage,  UTI, vaginal irritation from recent intercourse, STI; doubt heterotopic pregnancy, torsion, ruptured cyst   Patient's presentation is most consistent with acute presentation with potential threat to life or bodily function.   PLAN: Workup initiated from triage.  Normal hemoglobin.  hCG of 8875.  Urine does show large amount of blood but she is having vaginal bleeding.  She is not having urinary symptoms.  Review of records in care everywhere shows that blood type is a positive.  No  indication for RhoGAM.  Transvaginal ultrasound reviewed today by myself and the radiologist and shows single intrauterine gestation measuring approximately 7 weeks, 4 days without any sign of cardiac activity.  Findings compatible with failed pregnancy.  Discussed this with patient and significant other at bedside.  We discussed options of expectant management versus medication such as Cytotec versus D&C.  At this time patient states she would prefer expectant management with close follow-up with [facility].  We did discuss what to expect with a spontaneous abortion.  Also discussed bleeding return precautions.  Discussed supportive care instructions.  Patient and significant other verbalized understanding.  Will discharge home.  Expressed condolences.   MEDICATIONS GIVEN IN ED: Medications - No data to display   ED COURSE:  At this time, I do not feel there is any life-threatening condition present. I reviewed all nursing notes, vitals, pertinent previous records.  All lab and urine results,  EKGs, imaging ordered have been independently reviewed and interpreted by myself.  I reviewed all available radiology reports from any imaging ordered this visit.  Based on my assessment, I feel the patient is safe to be discharged home without further emergent workup and can continue workup as an outpatient as needed. Discussed all findings, treatment plan as well as usual and customary return precautions.  They verbalize understanding and are comfortable with this plan.  Outpatient follow-up has been provided as needed.  All questions have been answered.    CONSULTS:  none   OUTSIDE RECORDS REVIEWED: Reviewed recent OB/GYN notes.       FINAL CLINICAL IMPRESSION(S) / ED DIAGNOSES   Final diagnoses:  Missed abortion     Rx / DC Orders   ED Discharge Orders     None        Note:  This document was prepared using Dragon voice recognition software and may include unintentional dictation errors.   Genell Thede, Clover Dao, DO 03/22/24 [id number]

## 2024-03-21 NOTE — ED Triage Notes (Signed)
 Pt presents via POV c/o vaginal spotting after intercourse. Reports she is 10weeks OB.   Denies pain. Reports small amount of spotting immediately after intercourse. Reports spotting was light pink but has turned darker with wiping after using restroom.

## 2024-03-22 NOTE — Discharge Instructions (Signed)
 You may alternate over the counter Tylenol 1000 mg every 6 hours as needed for pain, fever and Ibuprofen 800 mg every 6-8 hours as needed for pain, fever.  Please take Ibuprofen with food.  Do not take more than 4000 mg of Tylenol (acetaminophen) in a 24 hour period.  Please follow-up with [facility] clinic for further management.  We have discussed options for your missed miscarriage including expectant management (allowing your body to naturally pass the failed pregnancy) versus taking a medications to expedite this process versus surgical intervention called a D&C.  At this time you have chosen expectant management.  It will be normal to have bleeding, passing clots, possibly seeing what looks like tissue.  If you are ever bleeding so heavily that you are soaking through more than a pad an hour for more than 2 straight hours, feel like you are going to pass out or you do pass out, have vomiting that does not stop, have pain that is uncontrolled, have fever of 100.4 or higher, please return to the emergency department.  We are sorry for your loss.

## 2024-03-25 ENCOUNTER — Emergency Department
Admission: EM | Admit: 2024-03-25 | Discharge: 2024-03-26 | Disposition: A | Discharge status: Home / Self Care | Attending: Emergency Medicine | Admitting: Emergency Medicine

## 2024-03-25 ENCOUNTER — Other Ambulatory Visit: Payer: Self-pay

## 2024-03-25 ENCOUNTER — Encounter: Payer: Self-pay | Admitting: Emergency Medicine

## 2024-03-25 DIAGNOSIS — O039 Complete or unspecified spontaneous abortion without complication: Secondary | ICD-10-CM | POA: Diagnosis not present

## 2024-03-25 LAB — CBC
HCT: 39.2 % (ref 36.0–46.0)
Hemoglobin: 13 g/dL (ref 12.0–15.0)
MCH: 28.7 pg (ref 26.0–34.0)
MCHC: 33.2 g/dL (ref 30.0–36.0)
MCV: 86.5 fL (ref 80.0–100.0)
Platelets: 262 10*3/uL (ref 150–400)
RBC: 4.53 MIL/uL (ref 3.87–5.11)
RDW: 12.2 % (ref 11.5–15.5)
WBC: 9.5 10*3/uL (ref 4.0–10.5)
nRBC: 0 % (ref 0.0–0.2)

## 2024-03-25 LAB — POC URINE PREG, ED: Preg Test, Ur: POSITIVE — AB

## 2024-03-25 LAB — HCG, QUANTITATIVE, PREGNANCY: hCG, Beta Chain, Quant, S: 3679 m[IU]/mL — ABNORMAL HIGH (ref <5)

## 2024-03-25 LAB — SAMPLE TO BLOOD BANK

## 2024-03-25 MED ORDER — ONDANSETRON HCL 4 MG/2ML IJ SOLN
4.0000 mg | Freq: Once | INTRAMUSCULAR | Status: AC
  Administered 2024-03-25: 4 mg via INTRAVENOUS
  Filled 2024-03-25: qty 2

## 2024-03-25 MED ORDER — MORPHINE SULFATE (PF) 4 MG/ML IV SOLN
4.0000 mg | Freq: Once | INTRAVENOUS | Status: AC
  Administered 2024-03-25: 4 mg via INTRAVENOUS
  Filled 2024-03-25: qty 1

## 2024-03-25 MED ORDER — KETOROLAC TROMETHAMINE 30 MG/ML IJ SOLN
30.0000 mg | Freq: Once | INTRAMUSCULAR | Status: AC
  Administered 2024-03-25: 30 mg via INTRAVENOUS
  Filled 2024-03-25: qty 1

## 2024-03-25 NOTE — ED Provider Notes (Signed)
 [facility] Provider Note    Event Date/Time   First MD Initiated Contact with Patient 03/25/24 2259     (approximate)   History   Miscarriage   HPI  Brianna Sutton is a 23 y.o. female G2, P1 with history of intracranial hypertension who presents to the emergency department with increased vaginal bleeding, abdominal cramping.  Was seen in the emergency department 4 days ago and diagnosed with a missed spontaneous abortion.  No fetal cardiac activity seen on ultrasound.  Patient reports increased bleeding and was able to pass what appears to be the fetus here in the emergency department.  States her bleeding and pain have improved somewhat already.  No fever, vomiting.   History provided by patient, husband.    Past Medical History:  Diagnosis Date   Intracranial hypertension     Past Surgical History:  Procedure Laterality Date   CHOLECYSTECTOMY     LUMBAR PUNCTURE  09/2023    MEDICATIONS:  Prior to Admission medications   Medication Sig Start Date End Date Taking? Authorizing Provider  acetaZOLAMIDE  ER (DIAMOX ) 500 MG capsule Take 1 capsule (500 mg total) by mouth every morning AND 2 capsules (1,000 mg total) every evening. 12/13/23   Penumalli, Vikram R, MD  levocetirizine (XYZAL ) 5 MG tablet Take 1 tablet (5 mg total) by mouth every evening. 01/24/24   Tapia, Leisa, PA-C  predniSONE  (STERAPRED UNI-PAK 21 TAB) 10 MG (21) TBPK tablet Take as directed on package.  (60 mg po on day 1, 50 mg po on day 2...) 01/24/24   Tapia, Leisa, PA-C  triamcinolone  ointment (KENALOG ) 0.1 % Apply 1 Application topically 2 (two) times daily. 01/24/24   Leavy Mole, PA-C    Physical Exam   Triage Vital Signs: ED Triage Vitals  Encounter Vitals Group     BP 03/25/24 2200 132/70     Girls Systolic BP Percentile --      Girls Diastolic BP Percentile --      Boys Systolic BP Percentile --      Boys Diastolic BP Percentile --      Pulse Rate 03/25/24 2200 84      Resp 03/25/24 2200 16     Temp 03/25/24 2200 98.1 F (36.7 C)     Temp Source 03/25/24 2200 Oral     SpO2 03/25/24 2200 100 %     Weight 03/25/24 2204 285 lb (129.3 kg)     Height 03/25/24 2204 5' 7 (1.702 m)     Head Circumference --      Peak Flow --      Pain Score 03/25/24 2204 10     Pain Loc --      Pain Education --      Exclude from Growth Chart --     Most recent vital signs: Vitals:   03/25/24 2200 03/26/24 0018  BP: 132/70 123/72  Pulse: 84 70  Resp: 16 16  Temp: 98.1 F (36.7 C)   SpO2: 100% 99%    CONSTITUTIONAL: Alert, responds appropriately to questions. Well-appearing; well-nourished HEAD: Normocephalic, atraumatic EYES: Conjunctivae clear, pupils appear equal, sclera nonicteric ENT: normal nose; moist mucous membranes NECK: Supple, normal ROM CARD: RRR; S1 and S2 appreciated RESP: Normal chest excursion without splinting or tachypnea; breath sounds clear and equal bilaterally; no wheezes, no rhonchi, no rales, no hypoxia or respiratory distress, speaking full sentences ABD/GI: Non-distended; soft, non-tender, no rebound, no guarding, no peritoneal signs BACK: The back appears normal  EXT: Normal ROM in all joints; no deformity noted, no edema SKIN: Normal color for age and race; warm; no rash on exposed skin NEURO: Moves all extremities equally, normal speech PSYCH: The patient's mood and manner are appropriate.   ED Results / Procedures / Treatments   LABS: (all labs ordered are listed, but only abnormal results are displayed) Labs Reviewed  HCG, QUANTITATIVE, PREGNANCY - Abnormal; Notable for the following components:      Result Value   hCG, Beta Chain, Quant, S 3,679 (*)    All other components within normal limits  POC URINE PREG, ED - Abnormal; Notable for the following components:   Preg Test, Ur POSITIVE (*)    All other components within normal limits  CBC  HEMOGLOBIN AND HEMATOCRIT, BLOOD  SAMPLE TO BLOOD BANK      EKG:   RADIOLOGY: My personal review and interpretation of imaging:    I have personally reviewed all radiology reports.   No results found.   PROCEDURES:  Critical Care performed: No    Procedures    IMPRESSION / MDM / ASSESSMENT AND PLAN / ED COURSE  I reviewed the triage vital signs and the nursing notes.    Patient here with spontaneous abortion.  Passed a large amount of tissue here and now bleeding has improved.     DIFFERENTIAL DIAGNOSIS (includes but not limited to):   Retained products of conception, incomplete abortion, anemia   Patient's presentation is most consistent with acute presentation with potential threat to life or bodily function.   PLAN: Labs show hemoglobin of 13.  Blood type a positive.  No need for RhoGAM.  Will check hCG today just so that this can be trended to ensure that it is going down to 0.  No indication at this time to obtain emergent transvaginal ultrasound this time.  She has passed what appears to be the fetus.  Will monitor.  Will give pain medication here.  Will recheck H&H.  Anticipate discharge home if workup continues to be reassuring with outpatient OB/GYN follow-up.   MEDICATIONS GIVEN IN ED: Medications  morphine (PF) 4 MG/ML injection 4 mg (4 mg Intravenous Given 03/25/24 2341)  ketorolac (TORADOL) 30 MG/ML injection 30 mg (30 mg Intravenous Given 03/25/24 2341)  ondansetron  (ZOFRAN ) injection 4 mg (4 mg Intravenous Given 03/25/24 2340)     ED COURSE: Repeat hemoglobin has dropped slightly but stable at 12.1.  She remains hemodynamically stable and states bleeding has significantly improved.  Pain has almost resolved as well.  I feel she is safe for discharge with close follow-up with her OB/GYN to ensure that her hCG goes back down to 0.  It has dropped from 8875 down to 3679 today.  Discussed again bleeding return precautions.  Will discharge with pain and nausea medicine.  Discussed avoiding sexual intercourse for  at least 1 week or until all pain, bleeding has resolved.  At this time, I do not feel there is any life-threatening condition present. I reviewed all nursing notes, vitals, pertinent previous records.  All lab and urine results, EKGs, imaging ordered have been independently reviewed and interpreted by myself.  I reviewed all available radiology reports from any imaging ordered this visit.  Based on my assessment, I feel the patient is safe to be discharged home without further emergent workup and can continue workup as an outpatient as needed. Discussed all findings, treatment plan as well as usual and customary return precautions.  They verbalize understanding and are  comfortable with this plan.  Outpatient follow-up has been provided as needed.  All questions have been answered.    CONSULTS:  none   OUTSIDE RECORDS REVIEWED: Reviewed recent OB/GYN notes.       FINAL CLINICAL IMPRESSION(S) / ED DIAGNOSES   Final diagnoses:  Miscarriage     Rx / DC Orders   ED Discharge Orders          Ordered    oxyCODONE (ROXICODONE) 5 MG immediate release tablet  Every 8 hours PRN        03/26/24 0059    ibuprofen (ADVIL) 800 MG tablet  Every 8 hours PRN        03/26/24 0059    ondansetron  (ZOFRAN -ODT) 4 MG disintegrating tablet  Every 6 hours PRN        03/26/24 0059             Note:  This document was prepared using Dragon voice recognition software and may include unintentional dictation errors.   Jazariah Teall, Josette SAILOR, DO 03/26/24 [phone]

## 2024-03-25 NOTE — ED Notes (Signed)
 Pt called out stating I think I just passed it. Can you help me I don't want to see it. I felt like this ball come out, it is in my pad.  This RN assisted pt and removed what pt asked to be removed from pad. This RN sat with pt for a few minutes to answer questions that pt may have had as well as provide comfort.

## 2024-03-25 NOTE — ED Triage Notes (Signed)
 To ER with report of lower abdominal/ pelvic cramping. 3 days ago told incomplete pregnancy and miscarrying. Reports bleeding with lots of clots. Going through 2 clots a day.   Tylenol 6 hours ago.

## 2024-03-25 NOTE — ED Notes (Signed)
 First RN Note: Pt to ED via POV with spouse, pt states was seen several days ago and told she had a miscarriage. Pt states she is now having contractions and c/o severe abd pain.

## 2024-03-26 LAB — HEMOGLOBIN AND HEMATOCRIT, BLOOD
HCT: 36.5 % (ref 36.0–46.0)
Hemoglobin: 12.1 g/dL (ref 12.0–15.0)

## 2024-03-26 MED ORDER — ONDANSETRON 4 MG PO TBDP: 4.0000 mg | ORAL_TABLET | Freq: Four times a day (QID) | ORAL | 0 refills | Status: AC | PRN

## 2024-03-26 MED ORDER — OXYCODONE HCL 5 MG PO TABS: 5.0000 mg | ORAL_TABLET | Freq: Three times a day (TID) | ORAL | 0 refills | Status: AC | PRN

## 2024-03-26 MED ORDER — IBUPROFEN 800 MG PO TABS: 800.0000 mg | ORAL_TABLET | Freq: Three times a day (TID) | ORAL | 0 refills | Status: AC | PRN

## 2024-03-26 NOTE — Discharge Instructions (Addendum)
 Please follow up with your OB/GYN to ensure that your hCG levels go back to 0 and that your bleeding has resolved.  I recommend avoiding sexual intercourse for at least 1 week or until all pain, bleeding has resolved.    If you have fever of 100.4 or higher, increased pain, vaginal bleeding that is so heavy that you are soaking through more than a pad an hour for more than 2 straight hours, feel lightheaded like you are going to pass out, short of breath, have chest pain, please return to the emergency department.  You may alternate over the counter Tylenol 1000 mg every 6 hours as needed for pain, fever and Ibuprofen 800 mg every 6-8 hours as needed for pain, fever.  Please take Ibuprofen with food.  Do not take more than 4000 mg of Tylenol (acetaminophen) in a 24 hour period.  You are being provided a prescription for opiates (also known as narcotics) for pain control.  Opiates can be addictive and should only be used when absolutely necessary for pain control when other alternatives do not work.  We recommend you only use them for the recommended amount of time and only as prescribed.  Please do not take with other sedative medications or alcohol.  Please do not drive, operate machinery, make important decisions while taking opiates.  Please note that these medications can be addictive and have high abuse potential.  Patients can become addicted to narcotics after only taking them for a few days.  Please keep these medications locked away from children, teenagers or any family members with history of substance abuse.  Narcotic pain medicine may also make you constipated.  You may use over-the-counter medications such as MiraLAX, Colace to prevent constipation.  If you become constipated, you may use over-the-counter enemas as needed.  Itching and nausea are also common side effects of narcotic pain medication.  If you develop uncontrolled vomiting or a rash, please stop these medications and seek medical  care.

## 2024-05-28 ENCOUNTER — Encounter: Payer: Self-pay | Admitting: Nurse Practitioner

## 2024-05-28 DIAGNOSIS — E785 Hyperlipidemia, unspecified: Secondary | ICD-10-CM | POA: Insufficient documentation

## 2024-05-28 NOTE — Progress Notes (Deleted)
 Name: Brianna Sutton   MRN: [id number]    DOB: 04/10/2001   Date:05/28/2024       Progress Note  Subjective  Chief Complaint  No chief complaint on file.   HPI  Patient presents for annual CPE. Discussed the use of AI scribe software for clinical note transcription with the patient, who gave verbal consent to proceed.  History of Present Illness     Diet: *** Exercise: ***  Sleep: *** Last dental exam:*** Last eye exam: ***  Flowsheet Row Office Visit from 01/24/2024 in [facility]  AUDIT-C Score 1    Depression: Phq 9 is  {Desc; negative/positive:13464}    05/27/2023    2:27 PM  Depression screen PHQ 2/9  Decreased Interest 0  Down, Depressed, Hopeless 0  PHQ - 2 Score 0   Hypertension: BP Readings from Last 3 Encounters:  03/26/24 123/72  03/22/24 128/68  01/24/24 110/70   Obesity: Wt Readings from Last 3 Encounters:  03/25/24 285 lb (129.3 kg)  03/21/24 285 lb (129.3 kg)  01/24/24 285 lb (129.3 kg)   BMI Readings from Last 3 Encounters:  03/25/24 44.64 kg/m  03/21/24 44.64 kg/m  01/24/24 44.64 kg/m     Vaccines:  HPV: up to at age 54 , ask insurance if age between 63-45  Shingrix: 58-64 yo and ask insurance if covered when patient above 17 yo Pneumonia:  educated and discussed with patient. Flu:  educated and discussed with patient.  Hep C Screening: completed STD testing and prevention (HIV/chl/gon/syphilis): completed Intimate partner violence:*** Sexual History : Menstrual History/LMP/Abnormal Bleeding:  LMC: Incontinence Symptoms:   Breast cancer:  - Last Mammogram: does not qualify - BRCA gene screening: none  Osteoporosis: Discussed high calcium and vitamin D  supplementation, weight bearing exercises  Cervical cancer screening: due  Skin cancer: Discussed monitoring for atypical lesions  Colorectal cancer: does not qualify   Lung cancer:   Low Dose CT Chest recommended if Age 46-80 years, 20  pack-year currently smoking OR have quit w/in 15years. Patient does not qualify.   ECG: none  Advanced Care Planning: A voluntary discussion about advance care planning including the explanation and discussion of advance directives.  Discussed health care proxy and Living will, and the patient was able to identify a health care proxy as ***.  Patient {DOES_DOES WNU:81435} have a living will at present time. If patient does have living will, I have requested they bring this to the clinic to be scanned in to their chart.  Lipids: Lab Results  Component Value Date   CHOL 195 05/27/2023   CHOL 205 (H) 05/28/2017   CHOL 184 (H) 05/22/2016   Lab Results  Component Value Date   HDL 39 (L) 05/27/2023   HDL 40 (L) 05/28/2017   HDL 45 05/22/2016   Lab Results  Component Value Date   LDLCALC 132 (H) 05/27/2023   LDLCALC 137 (H) 05/28/2017   LDLCALC 107 (H) 05/22/2016   Lab Results  Component Value Date   TRIG 127 05/27/2023   TRIG 138 05/28/2017   TRIG 159 (H) 05/22/2016   Lab Results  Component Value Date   CHOLHDL 5.0 (H) 05/27/2023   CHOLHDL 5.1 05/28/2017   CHOLHDL 4.1 05/22/2016   No results found for: LDLDIRECT  Glucose: Glucose  Date Value Ref Range Status  07/25/2013 97 65 - 99 mg/dL Final  92/90/7985 84 65 - 99 mg/dL Final  92/91/7985 888 (H) 65 - 99 mg/dL Final  Glucose, Bld  Date Value Ref Range Status  03/21/2024 93 70 - 99 mg/dL Final    Comment:    Glucose reference range applies only to samples taken after fasting for at least 8 hours.  10/19/2023 108 (H) 70 - 99 mg/dL Final    Comment:    Glucose reference range applies only to samples taken after fasting for at least 8 hours.  05/27/2023 92 65 - 99 mg/dL Final    Comment:    .            Fasting reference interval .     Patient Active Problem List   Diagnosis Date Noted  . IIH (idiopathic intracranial hypertension) 05/27/2023  . Class 3 severe obesity due to excess calories with serious  comorbidity and body mass index (BMI) of 45.0 to 49.9 in adult 05/27/2023    Past Surgical History:  Procedure Laterality Date  . CHOLECYSTECTOMY    . LUMBAR PUNCTURE  09/2023    Family History  Problem Relation Age of Onset  . Sleep apnea Neg Hx   . Pseudotumor cerebri Neg Hx     Social History   Socioeconomic History  . Marital status: Single    Spouse name: Not on file  . Number of children: 1  . Years of education: Not on file  . Highest education level: Some college, no degree  Occupational History  . Not on file  Tobacco Use  . Smoking status: Never    Passive exposure: Never  . Smokeless tobacco: Never  Vaping Use  . Vaping status: Never Used  Substance and Sexual Activity  . Alcohol use: No  . Drug use: Never  . Sexual activity: Yes    Birth control/protection: None  Other Topics Concern  . 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 Difficulty of Paying Living Expenses: Not very hard  Food Insecurity: No Food Insecurity (01/24/2024)   Hunger Vital Sign   . Worried About Programme researcher, broadcasting/film/video in the Last Year: Never true   . Ran Out of Food in the Last Year: Never true  Transportation Needs: No Transportation Needs (01/24/2024)   PRAPARE - Transportation   . Lack of Transportation (Medical): No   . Lack of Transportation (Non-Medical): No  Physical Activity: Sufficiently Active (01/24/2024)   Exercise Vital Sign   . Days of Exercise per Week: 5 days   . Minutes of Exercise per Session: 30 min  Stress: Stress Concern Present (01/24/2024)   [employer] of Occupational Health - Occupational Stress Questionnaire   . Feeling of Stress : To some extent  Social Connections: Moderately Integrated (01/24/2024)   Social Connection and  Isolation Panel   . Frequency of Communication with Friends and Family: More than three times a week   . Frequency of Social Gatherings with Friends and Family: More than three times a week   . Attends Religious Services: More than 4 times per year   . Active Member of Clubs or Organizations: No   . Attends Banker Meetings: Not on file   . Marital Status: Living with partner  Intimate Partner Violence: Not on file     Current Outpatient Medications:  .  acetaZOLAMIDE  ER (DIAMOX )  500 MG capsule, Take 1 capsule (500 mg total) by mouth every morning AND 2 capsules (1,000 mg total) every evening., Disp: 270 capsule, Rfl: 4 .  ibuprofen  (ADVIL ) 800 MG tablet, Take 1 tablet (800 mg total) by mouth every 8 (eight) hours as needed., Disp: 30 tablet, Rfl: 0 .  levocetirizine (XYZAL ) 5 MG tablet, Take 1 tablet (5 mg total) by mouth every evening., Disp: 90 tablet, Rfl: 1 .  ondansetron  (ZOFRAN -ODT) 4 MG disintegrating tablet, Take 1 tablet (4 mg total) by mouth every 6 (six) hours as needed for nausea or vomiting., Disp: 20 tablet, Rfl: 0 .  oxyCODONE  (ROXICODONE ) 5 MG immediate release tablet, Take 1 tablet (5 mg total) by mouth every 8 (eight) hours as needed., Disp: 15 tablet, Rfl: 0 .  predniSONE  (STERAPRED UNI-PAK 21 TAB) 10 MG (21) TBPK tablet, Take as directed on package.  (60 mg po on day 1, 50 mg po on day 2...), Disp: 21 tablet, Rfl: 0 .  triamcinolone  ointment (KENALOG ) 0.1 %, Apply 1 Application topically 2 (two) times daily., Disp: 30 g, Rfl: 0  No Known Allergies   ROS  Constitutional: Negative for fever or weight change.  Respiratory: Negative for cough and shortness of breath.   Cardiovascular: Negative for chest pain or palpitations.  Gastrointestinal: Negative for abdominal pain, no bowel changes.  Musculoskeletal: Negative for gait problem or joint swelling.  Skin: Negative for rash.  Neurological: Negative for dizziness or headache.  No other specific  complaints in a complete review of systems (except as listed in HPI above).   Objective  There were no vitals filed for this visit.  There is no height or weight on file to calculate BMI.  Physical Exam Vitals reviewed.  Constitutional:      Appearance: Normal appearance.  HENT:     Head: Normocephalic.     Right Ear: Tympanic membrane normal.     Left Ear: Tympanic membrane normal.     Nose: Nose normal.  Eyes:     Extraocular Movements: Extraocular movements intact.     Conjunctiva/sclera: Conjunctivae normal.     Pupils: Pupils are equal, round, and reactive to light.  Neck:     Thyroid : No thyroid  mass, thyromegaly or thyroid  tenderness.  Cardiovascular:     Rate and Rhythm: Normal rate and regular rhythm.     Pulses: Normal pulses.     Heart sounds: Normal heart sounds.  Pulmonary:     Effort: Pulmonary effort is normal.     Breath sounds: Normal breath sounds.  Abdominal:     General: Bowel sounds are normal.     Palpations: Abdomen is soft.  Musculoskeletal:        General: Normal range of motion.     Cervical back: Normal range of motion and neck supple.     Right lower leg: No edema.     Left lower leg: No edema.  Skin:    General: Skin is warm and dry.     Capillary Refill: Capillary refill takes less than 2 seconds.  Neurological:     General: No focal deficit present.     Mental Status: She is alert and oriented to person, place, and time. Mental status is at baseline.  Psychiatric:        Mood and Affect: Mood normal.        Behavior: Behavior normal.        Thought Content: Thought content normal.        Judgment:  Judgment normal.     Recent Results (from the past 2160 hours)  CBC     Status: Abnormal   Collection Time: 03/21/24  8:38 PM  Result Value Ref Range   WBC 10.7 (H) 4.0 - 10.5 K/uL   RBC 4.46 3.87 - 5.11 MIL/uL   Hemoglobin 12.9 12.0 - 15.0 g/dL   HCT 61.1 63.9 - 53.9 %   MCV 87.0 80.0 - 100.0 fL   MCH 28.9 26.0 - 34.0 pg   MCHC  33.2 30.0 - 36.0 g/dL   RDW 87.7 88.4 - 84.4 %   Platelets 257 150 - 400 K/uL   nRBC 0.0 0.0 - 0.2 %    Comment: Performed at [facility], [address] [place], [place] [address]  hCG, quantitative, pregnancy     Status: Abnormal   Collection Time: 03/21/24  8:38 PM  Result Value Ref Range   hCG, Beta Chain, Quant, S 8,875 (H) <5 mIU/mL    Comment:          GEST. AGE      CONC.  (mIU/mL)   <=1 WEEK        5 - 50     2 WEEKS       50 - 500     3 WEEKS       100 - 10,000     4 WEEKS     1,000 - 30,000     5 WEEKS     3,500 - 115,000   6-8 WEEKS     12,000 - 270,000    12 WEEKS     15,000 - 220,000        FEMALE AND NON-PREGNANT FEMALE:     LESS THAN 5 mIU/mL Performed at [facility], [address] [place], [place] [address]   Urinalysis, Routine w reflex microscopic -Urine, Clean Catch     Status: Abnormal   Collection Time: 03/21/24  8:38 PM  Result Value Ref Range   Color, Urine YELLOW (A) YELLOW   APPearance HAZY (A) CLEAR   Specific Gravity, Urine 1.021 1.005 - 1.030   pH 6.0 5.0 - 8.0   Glucose, UA NEGATIVE NEGATIVE mg/dL   Hgb urine dipstick LARGE (A) NEGATIVE   Bilirubin Urine NEGATIVE NEGATIVE   Ketones, ur NEGATIVE NEGATIVE mg/dL   Protein, ur NEGATIVE NEGATIVE mg/dL   Nitrite NEGATIVE NEGATIVE   Leukocytes,Ua NEGATIVE NEGATIVE   RBC / HPF >50 0 - 5 RBC/hpf   WBC, UA 0-5 0 - 5 WBC/hpf   Bacteria, UA RARE (A) NONE SEEN   Squamous Epithelial / HPF 6-10 0 - 5 /HPF   Mucus PRESENT     Comment: Performed at [facility], [address] [place], [place] [address]  Comprehensive metabolic panel     Status: None   Collection Time: 03/21/24  8:38 PM  Result Value Ref Range   Sodium 138 135 - 145 mmol/L   Potassium 3.8 3.5 - 5.1 mmol/L   Chloride 104 98 - 111 mmol/L   CO2 24 22 - 32 mmol/L   Glucose, Bld 93 70 - 99 mg/dL    Comment: Glucose reference range applies only to samples taken after fasting for at least 8 hours.   BUN 11 6  - 20 mg/dL   Creatinine, Ser 9.48 0.44 - 1.00 mg/dL   Calcium 9.9 8.9 - 89.6 mg/dL   Total Protein 7.4 6.5 - 8.1 g/dL   Albumin 4.0 3.5 - 5.0  g/dL   AST 18 15 - 41 U/L   ALT 20 0 - 44 U/L   Alkaline Phosphatase 52 38 - 126 U/L   Total Bilirubin 0.5 0.0 - 1.2 mg/dL   GFR, Estimated >39 >39 mL/min    Comment: (NOTE) Calculated using the CKD-EPI Creatinine Equation (2021)    Anion gap 10 5 - 15    Comment: Performed at [facility], [address] [place], [place] [address]  POC urine preg, ED     Status: Abnormal   Collection Time: 03/21/24  8:45 PM  Result Value Ref Range   Preg Test, Ur POSITIVE (A) NEGATIVE    Comment:        THE SENSITIVITY OF THIS METHODOLOGY IS >24 mIU/mL   Sample to Blood Bank     Status: None   Collection Time: 03/25/24 10:07 PM  Result Value Ref Range   Blood Bank Specimen SAMPLE AVAILABLE FOR TESTING    Sample Expiration      03/28/2024,2359 Performed at [facility], [address] [place], [place] [address]   hCG, quantitative, pregnancy     Status: Abnormal   Collection Time: 03/25/24 10:07 PM  Result Value Ref Range   hCG, Beta Chain, Quant, S 3,679 (H) <5 mIU/mL    Comment:          GEST. AGE      CONC.  (mIU/mL)   <=1 WEEK        5 - 50     2 WEEKS       50 - 500     3 WEEKS       100 - 10,000     4 WEEKS     1,000 - 30,000     5 WEEKS     3,500 - 115,000   6-8 WEEKS     12,000 - 270,000    12 WEEKS     15,000 - 220,000        FEMALE AND NON-PREGNANT FEMALE:     LESS THAN 5 mIU/mL Performed at [facility], [address] [place], [place] [address]   CBC     Status: None   Collection Time: 03/25/24 10:10 PM  Result Value Ref Range   WBC 9.5 4.0 - 10.5 K/uL   RBC 4.53 3.87 - 5.11 MIL/uL   Hemoglobin 13.0 12.0 - 15.0 g/dL   HCT 60.7 63.9 - 53.9 %   MCV 86.5 80.0 - 100.0 fL   MCH 28.7 26.0 - 34.0 pg   MCHC 33.2 30.0 - 36.0 g/dL   RDW 87.7 88.4 - 84.4 %   Platelets 262 150 - 400 K/uL   nRBC 0.0 0.0  - 0.2 %    Comment: Performed at [facility], [address] [place], [place] [address]  POC urine preg, ED     Status: Abnormal   Collection Time: 03/25/24 10:14 PM  Result Value Ref Range   Preg Test, Ur POSITIVE (A) NEGATIVE    Comment:        THE SENSITIVITY OF THIS METHODOLOGY IS >24 mIU/mL   Hemoglobin and hematocrit, blood     Status: None   Collection Time: 03/25/24 11:51 PM  Result Value Ref Range   Hemoglobin 12.1 12.0 - 15.0 g/dL   HCT 63.4 63.9 - 53.9 %    Comment: Performed at [facility], [address], [place], [place] [address]    Diabetic Foot Exam: Diabetic Foot Exam -  Simple   No data filed    ***  Fall Risk:    05/27/2023    2:27 PM  Fall Risk   Falls in the past year? 0  Number falls in past yr: 0  Injury with Fall? 0  Risk for fall due to : No Fall Risks   ***  Functional Status Survey:   ***  Assessment & Plan  There are no diagnoses linked to this encounter.  -USPSTF grade A and B recommendations reviewed with patient; age-appropriate recommendations, preventive care, screening tests, etc discussed and encouraged; healthy living encouraged; see AVS for patient education given to patient -Discussed importance of 150 minutes of physical activity weekly, eat two servings of fish weekly, eat one serving of tree nuts ( cashews, pistachios, pecans, almonds.SABRA) every other day, eat 6 servings of fruit/vegetables daily and drink plenty of water and avoid sweet beverages.   -Reviewed Health Maintenance: ***

## 2024-06-18 ENCOUNTER — Telehealth: Payer: Self-pay | Admitting: Diagnostic Neuroimaging

## 2024-06-18 NOTE — Telephone Encounter (Signed)
 LVM and sent mychart msg informing pt of need to reschedule 06/19/24 appt - MD out

## 2024-06-19 ENCOUNTER — Telehealth: Admitting: Diagnostic Neuroimaging

## 2024-07-25 NOTE — Progress Notes (Unsigned)
 [facility]  PATIENT: Brianna Sutton DOB: 2001/05/08  REFERRING CLINICIAN: Gareth Mliss FALCON, FNP HISTORY FROM: PATIENT  REASON FOR VISIT: IIH  Virtual Visit via Video Note  I connected with Brianna Sutton on 07/26/24 at 10:45 AM EDT by a video enabled telemedicine application and verified that I am speaking with the correct person using two identifiers.  Location: Patient: at her home Provider: in the office    I discussed the limitations of evaluation and management by telemedicine and the availability of in person appointments. The patient expressed understanding and agreed to proceed.  HISTORY OF PRESENT ILLNESS:  Update 07/26/24 SS: HST April 2025 did not show any OSA. Referred to dentistry for an oral device for snoring. She was pregnant, had miscarriage in the 1st trimester. Stopped Diamox  in May when found out pregnant. Feeling worse without Diamox , last week had episode of head pressure, pain down spine, ringing in ears, can see flashing in her eyes took Tylenol. Resolved. Is not on birth control. No weight gain. Menstrual cycle is 10 days late.   UPDATE (12/13/23, VRP): Since last visit, some sxs are better. Had IUD taken out in Jan 2025. Eye exam in Nov 2024 was better. HA are resolved.  UPDATE (05/09/23, VRP): Since last visit, increased acetazolamide  in May 2024; symptoms overall improved, but not totally resolved. Eye exam in June was slightly worse. Trying to walk and exercise.  PRIOR HPI (12/23/22, VRP): 23 year old female here for evaluation of papilledema.  Patient had routine eye exam in December 2023, and papilledema was noted.  She was referred to retina specialist for further evaluation.  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  MRI brain and orbits were ordered.  Patient has had some issues with weight gain in the last 3 months.  She has had some episodes of transient visual obscuration with colorful lights, with some episodes triggered by  straining with weight lifting.  Sometimes associated with severe pressure explosion sensation in her head. She is also had some left-sided ringing in your ears where she can hear her heartbeat.  All the symptoms started in the last 3 months.  Patient had pregnancy and delivery in 2020 with epidural.  Now patient has IUD for contraception.  REVIEW OF SYSTEMS: Full 14 system review of systems performed and negative with exception of: as per HPI.  ALLERGIES: No Known Allergies  HOME MEDICATIONS: Outpatient Medications Prior to Visit  Medication Sig Dispense Refill   acetaZOLAMIDE  ER (DIAMOX ) 500 MG capsule Take 1 capsule (500 mg total) by mouth every morning AND 2 capsules (1,000 mg total) every evening. 270 capsule 4   ibuprofen  (ADVIL ) 800 MG tablet Take 1 tablet (800 mg total) by mouth every 8 (eight) hours as needed. 30 tablet 0   levocetirizine (XYZAL ) 5 MG tablet Take 1 tablet (5 mg total) by mouth every evening. 90 tablet 1   ondansetron  (ZOFRAN -ODT) 4 MG disintegrating tablet Take 1 tablet (4 mg total) by mouth every 6 (six) hours as needed for nausea or vomiting. 20 tablet 0   oxyCODONE  (ROXICODONE ) 5 MG immediate release tablet Take 1 tablet (5 mg total) by mouth every 8 (eight) hours as needed. 15 tablet 0   predniSONE  (STERAPRED UNI-PAK 21 TAB) 10 MG (21) TBPK tablet Take as directed on package.  (60 mg po on day 1, 50 mg po on day 2...) 21 tablet 0   triamcinolone  ointment (KENALOG ) 0.1 % Apply 1 Application topically 2 (two) times daily. 30 g 0  No facility-administered medications prior to visit.    PAST MEDICAL HISTORY: Past Medical History:  Diagnosis Date   Intracranial hypertension     PAST SURGICAL HISTORY: Past Surgical History:  Procedure Laterality Date   CHOLECYSTECTOMY     LUMBAR PUNCTURE  09/2023    FAMILY HISTORY: Family History  Problem Relation Age of Onset   Sleep apnea Neg Hx    Pseudotumor cerebri Neg Hx     SOCIAL HISTORY: Social History    Socioeconomic History   Marital status: Single    Spouse name: Not on file   Number of children: 1   Years of education: Not on file   Highest education level: Some college, no degree  Occupational History   Not on file  Tobacco Use   Smoking status: Never    Passive exposure: Never   Smokeless tobacco: Never  Vaping Use   Vaping status: Never Used  Substance and Sexual Activity   Alcohol use: No   Drug use: Never   Sexual activity: Yes    Birth control/protection: None  Other Topics Concern   Not on file  Social History Narrative   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   Right handed   Caffeine: 1 cup/day   Social Drivers of Health   Financial Resource Strain: Low Risk  (01/24/2024)   Overall Financial Resource Strain (CARDIA)    Difficulty of Paying Living Expenses: Not very hard  Food Insecurity: No Food Insecurity (01/24/2024)   Hunger Vital Sign    Worried About Running Out of Food in the Last Year: Never true    Ran Out of Food in the Last Year: Never true  Transportation Needs: No Transportation Needs (01/24/2024)   PRAPARE - Administrator, Civil Service (Medical): No    Lack of Transportation (Non-Medical): No  Physical Activity: Sufficiently Active (01/24/2024)   Exercise Vital Sign    Days of Exercise per Week: 5 days    Minutes of Exercise per Session: 30 min  Stress: Stress Concern Present (01/24/2024)   [employer] of Occupational Health - Occupational Stress Questionnaire    Feeling of Stress : To some extent  Social Connections: Moderately Integrated (01/24/2024)   Social Connection and Isolation Panel    Frequency of Communication with Friends and Family: More than three times a week    Frequency of Social Gatherings with Friends and Family: More than three times a week    Attends Religious Services: More than 4 times per year    Active Member of [employer] or Organizations: No    Attends Hospital doctor: Not on file    Marital Status: Living with partner  Intimate Partner Violence: Not on file     PHYSICAL EXAM  GENERAL EXAM/CONSTITUTIONAL: Vitals:  There were no vitals filed for this visit.  There is no height or weight on file to calculate BMI. Wt Readings from Last 3 Encounters:  03/25/24 285 lb (129.3 kg)  03/21/24 285 lb (129.3 kg)  01/24/24 285 lb (129.3 kg)    Virtual Visit   DIAGNOSTIC DATA (LABS, IMAGING, TESTING) - I reviewed patient records, labs, notes, testing and imaging myself where available.  Lab Results  Component Value Date   WBC 9.5 03/25/2024   HGB 12.1 03/25/2024   HCT 36.5 03/25/2024   MCV 86.5 03/25/2024   PLT 262 03/25/2024      Component Value Date/Time   NA 138 03/21/2024 2038  NA 138 07/25/2013 0607   K 3.8 03/21/2024 2038   K 3.8 07/25/2013 0607   CL 104 03/21/2024 2038   CL 107 07/25/2013 0607   CO2 24 03/21/2024 2038   CO2 24 07/25/2013 0607   GLUCOSE 93 03/21/2024 2038   GLUCOSE 97 07/25/2013 0607   BUN 11 03/21/2024 2038   BUN 10 07/25/2013 0607   CREATININE 0.51 03/21/2024 2038   CREATININE 0.70 05/27/2023 1451   CALCIUM 9.9 03/21/2024 2038   CALCIUM 9.2 07/25/2013 0607   PROT 7.4 03/21/2024 2038   PROT 7.4 07/25/2013 0607   ALBUMIN 4.0 03/21/2024 2038   ALBUMIN 4.1 07/25/2013 0607   AST 18 03/21/2024 2038   AST 16 07/25/2013 0607   ALT 20 03/21/2024 2038   ALT 17 07/25/2013 0607   ALKPHOS 52 03/21/2024 2038   ALKPHOS 158 07/25/2013 0607   BILITOT 0.5 03/21/2024 2038   BILITOT 0.5 07/25/2013 0607   GFRNONAA >60 03/21/2024 2038   GFRAA NOT CALCULATED 05/28/2017 1130   Lab Results  Component Value Date   CHOL 195 05/27/2023   HDL 39 (L) 05/27/2023   LDLCALC 132 (H) 05/27/2023   TRIG 127 05/27/2023   CHOLHDL 5.0 (H) 05/27/2023   Lab Results  Component Value Date   HGBA1C 5.5 05/27/2023   No results found for: VITAMINB12 Lab Results  Component Value Date   TSH 2.02 05/27/2023    11/23/22 MRI  brain / orbits -Intra-ocular protrusion of the optic nerve heads bilaterally. Vertical tortuosity of the intraorbital optic nerves with mild CSF prominence in the bilateral optic nerve sheaths. This constellation of findings suggests idiopathic intracranial hypertension (pseudotumor cerebri). -Additionally, narrowing of the bilateral distal transverse dural venous sinuses is questioned. This finding can also be seen in the setting of idiopathic intracranial hypertension. Consider an MR venogram of the head for further evaluation. 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 Otherwise unremarkable MRI appearance of the brain itself.  LP 1. Technically successful fluoroscopically guided lumbar puncture. 2. Mildly elevated opening CSF pressure of 23 cm water.  ASSESSMENT AND PLAN  23 y.o. year old female here with:  Dx:  1. IIH (idiopathic intracranial hypertension)     PLAN:  idiopathic intracranial hypertension (pseudotumor cerebri)  - Stopped Diamox  ER 500/1000 mg in May after finding out she was pregnant, unfortunately had a miscarriage in her first trimester. Planning to take a pregnancy test today, concerned she may be pregnant again.  Has noted some return of IIH symptoms without Diamox . Weight has remained stable. Sleep study was negative for OSA, referred to dentistry for a dental device.  - Recommend pregnancy test, follow-up with OB/GYN  - Urgent referral to ophthalmology Dr Maree for evaluation of papilledema - Consider restart Diamox  if pregnancy test is negative, have discussed the teratogenicity of Diamox  and that we do not recommend during pregnancy. If we start, try lower dose Diamox  ER 500 mg BID - If she is pregnant, will be serial ophthalmology evaluation - Of course recommend weight loss for long term management - Call for worsening symptoms, follow-up in 6 months with me virtually  Orders Placed This Encounter  Procedures   Ambulatory  referral to Ophthalmology   Brianna Gayland MANDES, DNP  [facility] [address] [place], [place] [address] [phone]

## 2024-07-26 ENCOUNTER — Telehealth: Admitting: Neurology

## 2024-07-26 ENCOUNTER — Telehealth: Payer: Self-pay | Admitting: Neurology

## 2024-07-26 DIAGNOSIS — G932 Benign intracranial hypertension: Secondary | ICD-10-CM | POA: Diagnosis not present

## 2024-07-26 NOTE — Telephone Encounter (Signed)
 Referral for Ophthalmology faxed  to [facility] Phone :[phone] Fax: [phone]

## 2024-07-26 NOTE — Patient Instructions (Addendum)
 Take a pregnancy test Urgent referral to ophthalmology for evaluation of papilledema May consider restarting Diamox , but Diamox  can be associated with teratogenic side effects for babies  Call me for any worsening symptoms

## 2024-08-06 NOTE — Telephone Encounter (Signed)
 Please call patient, not sure why she is missing her appointments at the eye doctor. Is very important she follow up with them. Thanks

## 2024-08-06 NOTE — Telephone Encounter (Signed)
 Received a call from [facility] on referral that was sent. They called to advise that patient has no showed her appointment with them two times. They stated she can be rescheduled if she wants to give them a call back. They just wanted to make you aware.

## 2024-08-09 NOTE — Telephone Encounter (Signed)
 Noted

## 2024-08-21 ENCOUNTER — Telehealth: Payer: Self-pay | Admitting: Neurology

## 2024-08-21 NOTE — Telephone Encounter (Signed)
 I reviewed ophthalmology note from Dr. Maree 08/20/2024 + disc edema.  Stopped Diamox  in the summer due to pregnancy then had a miscarriage.  She did not follow-up in May 2025 thus unsure if disc edema ever fully resolved on Diamox .  Exam still shows disc edema, likely recurrent to discontinue Diamox  but visual field remains clear bilaterally.  Recommend follow-up in 2 to 3 months.  Disc edema improved from 9/24 bilaterally.  I tried to call the patient, got voice mail. I sent a mychart note.

## 2024-09-28 ENCOUNTER — Encounter: Admitting: Nurse Practitioner

## 2025-02-13 ENCOUNTER — Telehealth: Admitting: Neurology
